# Patient Record
Sex: Female | Born: 1965 | Hispanic: No | Marital: Married | Smoking: Never smoker
Health system: Southern US, Community
[De-identification: ages and names within clinical notes are randomized; demographics above are authoritative.]

## PROBLEM LIST (undated history)

## (undated) DIAGNOSIS — J339 Nasal polyp, unspecified: Secondary | ICD-10-CM

## (undated) DIAGNOSIS — M069 Rheumatoid arthritis, unspecified: Secondary | ICD-10-CM

## (undated) DIAGNOSIS — L309 Dermatitis, unspecified: Secondary | ICD-10-CM

## (undated) DIAGNOSIS — J45909 Unspecified asthma, uncomplicated: Secondary | ICD-10-CM

## (undated) DIAGNOSIS — L509 Urticaria, unspecified: Secondary | ICD-10-CM

## (undated) DIAGNOSIS — L409 Psoriasis, unspecified: Secondary | ICD-10-CM

## (undated) HISTORY — DX: Unspecified asthma, uncomplicated: J45.909

## (undated) HISTORY — PX: SINOSCOPY: SHX187

## (undated) HISTORY — PX: BLADDER REPAIR: SHX76

## (undated) HISTORY — DX: Nasal polyp, unspecified: J33.9

## (undated) HISTORY — DX: Urticaria, unspecified: L50.9

## (undated) HISTORY — DX: Rheumatoid arthritis, unspecified: M06.9

## (undated) HISTORY — DX: Dermatitis, unspecified: L30.9

## (undated) HISTORY — PX: ADENOIDECTOMY: SUR15

---

## 1985-08-08 HISTORY — PX: OTHER SURGICAL HISTORY: SHX169

## 2000-09-15 ENCOUNTER — Other Ambulatory Visit: Admission: RE | Admit: 2000-09-15 | Discharge: 2000-09-15 | Payer: Self-pay | Admitting: *Deleted

## 2004-06-23 ENCOUNTER — Other Ambulatory Visit: Admission: RE | Admit: 2004-06-23 | Discharge: 2004-06-23 | Payer: Self-pay | Admitting: *Deleted

## 2005-10-12 ENCOUNTER — Other Ambulatory Visit: Admission: RE | Admit: 2005-10-12 | Discharge: 2005-10-12 | Payer: Self-pay | Admitting: *Deleted

## 2005-11-04 ENCOUNTER — Encounter: Admission: RE | Admit: 2005-11-04 | Discharge: 2005-11-04 | Payer: Self-pay | Admitting: Nurse Practitioner

## 2007-12-11 ENCOUNTER — Other Ambulatory Visit: Admission: RE | Admit: 2007-12-11 | Discharge: 2007-12-11 | Payer: Self-pay | Admitting: Family Medicine

## 2010-08-29 ENCOUNTER — Encounter: Payer: Self-pay | Admitting: Family Medicine

## 2010-12-17 ENCOUNTER — Other Ambulatory Visit: Payer: Self-pay | Admitting: Family Medicine

## 2010-12-17 ENCOUNTER — Other Ambulatory Visit (HOSPITAL_COMMUNITY)
Admission: RE | Admit: 2010-12-17 | Discharge: 2010-12-17 | Disposition: A | Discharge status: Home / Self Care | Payer: BC Managed Care – PPO | Source: Ambulatory Visit | Attending: Family Medicine | Admitting: Family Medicine

## 2010-12-17 DIAGNOSIS — Z124 Encounter for screening for malignant neoplasm of cervix: Secondary | ICD-10-CM | POA: Insufficient documentation

## 2010-12-17 DIAGNOSIS — Z1159 Encounter for screening for other viral diseases: Secondary | ICD-10-CM | POA: Insufficient documentation

## 2012-10-03 ENCOUNTER — Other Ambulatory Visit (HOSPITAL_COMMUNITY): Payer: Self-pay | Admitting: Family Medicine

## 2012-10-03 DIAGNOSIS — Z1231 Encounter for screening mammogram for malignant neoplasm of breast: Secondary | ICD-10-CM

## 2012-10-10 ENCOUNTER — Ambulatory Visit (HOSPITAL_COMMUNITY)
Admission: RE | Admit: 2012-10-10 | Discharge: 2012-10-10 | Disposition: A | Discharge status: Home / Self Care | Payer: BC Managed Care – PPO | Source: Ambulatory Visit | Attending: Family Medicine | Admitting: Family Medicine

## 2012-10-10 DIAGNOSIS — Z1231 Encounter for screening mammogram for malignant neoplasm of breast: Secondary | ICD-10-CM

## 2013-03-27 ENCOUNTER — Emergency Department (HOSPITAL_BASED_OUTPATIENT_CLINIC_OR_DEPARTMENT_OTHER)
Admission: EM | Admit: 2013-03-27 | Discharge: 2013-03-27 | Disposition: A | Discharge status: Home / Self Care | Payer: BC Managed Care – PPO | Attending: Emergency Medicine | Admitting: Emergency Medicine

## 2013-03-27 ENCOUNTER — Encounter (HOSPITAL_BASED_OUTPATIENT_CLINIC_OR_DEPARTMENT_OTHER): Payer: Self-pay | Admitting: Emergency Medicine

## 2013-03-27 DIAGNOSIS — S91119A Laceration without foreign body of unspecified toe without damage to nail, initial encounter: Secondary | ICD-10-CM

## 2013-03-27 DIAGNOSIS — Z880 Allergy status to penicillin: Secondary | ICD-10-CM | POA: Insufficient documentation

## 2013-03-27 DIAGNOSIS — S91109A Unspecified open wound of unspecified toe(s) without damage to nail, initial encounter: Secondary | ICD-10-CM | POA: Insufficient documentation

## 2013-03-27 DIAGNOSIS — W268XXA Contact with other sharp object(s), not elsewhere classified, initial encounter: Secondary | ICD-10-CM | POA: Insufficient documentation

## 2013-03-27 DIAGNOSIS — Z872 Personal history of diseases of the skin and subcutaneous tissue: Secondary | ICD-10-CM | POA: Insufficient documentation

## 2013-03-27 DIAGNOSIS — Y93E1 Activity, personal bathing and showering: Secondary | ICD-10-CM | POA: Insufficient documentation

## 2013-03-27 DIAGNOSIS — Y9289 Other specified places as the place of occurrence of the external cause: Secondary | ICD-10-CM | POA: Insufficient documentation

## 2013-03-27 HISTORY — DX: Psoriasis, unspecified: L40.9

## 2013-03-27 MED ORDER — GELATIN ABSORBABLE 12-7 MM EX MISC
1.0000 | Freq: Once | CUTANEOUS | Status: AC
  Administered 2013-03-27: 15:00:00 via TOPICAL

## 2013-03-27 MED ORDER — GELATIN ABSORBABLE 12-7 MM EX MISC
CUTANEOUS | Status: AC
  Filled 2013-03-27: qty 1

## 2013-03-27 NOTE — ED Provider Notes (Signed)
  CSN: [id number]     Arrival date & time 03/27/13  1353 History     First MD Initiated Contact with Patient 03/27/13 1358     Chief Complaint  Patient presents with  . Laceration   (Consider location/radiation/quality/duration/timing/severity/associated sxs/prior Treatment) Patient is a 47 y.o. female presenting with skin laceration. The history is provided by the patient. No language interpreter was used.  Laceration Location:  Toe Toe laceration location:  L little toe Length (cm):  1 Quality: avulsion   Foreign body present:  No foreign bodies Ineffective treatments:  None tried   Past Medical History  Diagnosis Date  . Psoriasis    Past Surgical History  Procedure Laterality Date  . Bladder repair     No family history on file. History  Substance Use Topics  . Smoking status: Never Smoker   . Smokeless tobacco: Not on file  . Alcohol Use: No   OB History   Grav Para Term Preterm Abortions TAB SAB Ect Mult Living                 Review of Systems  Skin: Positive for wound.  All other systems reviewed and are negative.    Allergies  Penicillins  Home Medications  No current outpatient prescriptions on file. BP 140/83  Pulse 66  Ht 5\' 4"  (1.626 m)  Wt 140 lb (63.504 kg)  BMI 24.02 kg/m2  SpO2 100% Physical Exam  Vitals reviewed. Constitutional: She appears well-developed and well-nourished.  HENT:  Head: Normocephalic.  Musculoskeletal: She exhibits tenderness.  1cm laceration between toes, nv and ns intact  Neurological: She is alert.  Skin: Skin is warm.  Psychiatric: She has a normal mood and affect.    ED Course   Procedures (including critical care time)  Labs Reviewed - No data to display No results found. 1. Laceration of toe, left, initial encounter     MDM  Gelfoam to laceration   Elson Areas, PA-C 03/27/13 1453

## 2013-03-27 NOTE — ED Notes (Signed)
Cut bottom of left pinky toe this am while in shower. Tetanus shot up to date

## 2013-03-28 NOTE — ED Provider Notes (Signed)
Medical screening examination/treatment/procedure(s) were performed by non-physician practitioner and as supervising physician I was immediately available for consultation/collaboration.   Audree Camel, MD 03/28/13 [phone]

## 2013-09-06 ENCOUNTER — Other Ambulatory Visit (HOSPITAL_COMMUNITY): Payer: Self-pay | Admitting: Family Medicine

## 2013-09-06 DIAGNOSIS — Z1231 Encounter for screening mammogram for malignant neoplasm of breast: Secondary | ICD-10-CM

## 2013-10-14 ENCOUNTER — Other Ambulatory Visit (HOSPITAL_COMMUNITY): Payer: Self-pay | Admitting: Family Medicine

## 2013-10-14 ENCOUNTER — Ambulatory Visit (HOSPITAL_COMMUNITY)
Admission: RE | Admit: 2013-10-14 | Discharge: 2013-10-14 | Disposition: A | Discharge status: Home / Self Care | Payer: BC Managed Care – PPO | Source: Ambulatory Visit | Attending: Family Medicine | Admitting: Family Medicine

## 2013-10-14 DIAGNOSIS — Z1231 Encounter for screening mammogram for malignant neoplasm of breast: Secondary | ICD-10-CM

## 2014-11-04 ENCOUNTER — Other Ambulatory Visit (HOSPITAL_COMMUNITY): Payer: Self-pay | Admitting: Family Medicine

## 2014-11-04 DIAGNOSIS — Z1231 Encounter for screening mammogram for malignant neoplasm of breast: Secondary | ICD-10-CM

## 2014-11-10 ENCOUNTER — Ambulatory Visit (HOSPITAL_COMMUNITY)
Admission: RE | Admit: 2014-11-10 | Discharge: 2014-11-10 | Disposition: A | Discharge status: Home / Self Care | Payer: BLUE CROSS/BLUE SHIELD | Source: Ambulatory Visit | Attending: Family Medicine | Admitting: Family Medicine

## 2014-11-10 DIAGNOSIS — Z1231 Encounter for screening mammogram for malignant neoplasm of breast: Secondary | ICD-10-CM | POA: Diagnosis present

## 2014-11-14 ENCOUNTER — Other Ambulatory Visit (HOSPITAL_COMMUNITY)
Admission: RE | Admit: 2014-11-14 | Discharge: 2014-11-14 | Disposition: A | Discharge status: Home / Self Care | Payer: BLUE CROSS/BLUE SHIELD | Source: Ambulatory Visit | Attending: Family Medicine | Admitting: Family Medicine

## 2014-11-14 ENCOUNTER — Other Ambulatory Visit: Payer: Self-pay | Admitting: Family Medicine

## 2014-11-14 DIAGNOSIS — Z1151 Encounter for screening for human papillomavirus (HPV): Secondary | ICD-10-CM | POA: Diagnosis present

## 2014-11-14 DIAGNOSIS — Z124 Encounter for screening for malignant neoplasm of cervix: Secondary | ICD-10-CM | POA: Insufficient documentation

## 2014-11-17 LAB — CYTOLOGY - PAP

## 2015-11-17 ENCOUNTER — Other Ambulatory Visit: Payer: Self-pay

## 2015-11-17 DIAGNOSIS — Z1231 Encounter for screening mammogram for malignant neoplasm of breast: Secondary | ICD-10-CM

## 2015-12-02 ENCOUNTER — Ambulatory Visit
Admission: RE | Admit: 2015-12-02 | Discharge: 2015-12-02 | Disposition: A | Discharge status: Home / Self Care | Payer: BLUE CROSS/BLUE SHIELD | Source: Ambulatory Visit

## 2015-12-02 DIAGNOSIS — Z1231 Encounter for screening mammogram for malignant neoplasm of breast: Secondary | ICD-10-CM

## 2015-12-24 ENCOUNTER — Encounter: Payer: Self-pay | Admitting: Allergy and Immunology

## 2015-12-24 ENCOUNTER — Ambulatory Visit (INDEPENDENT_AMBULATORY_CARE_PROVIDER_SITE_OTHER): Payer: BLUE CROSS/BLUE SHIELD | Admitting: Allergy and Immunology

## 2015-12-24 VITALS — BP 100/70 | HR 68 | Resp 12 | Ht 63.5 in | Wt 139.6 lb

## 2015-12-24 DIAGNOSIS — J339 Nasal polyp, unspecified: Secondary | ICD-10-CM

## 2015-12-24 DIAGNOSIS — J4540 Moderate persistent asthma, uncomplicated: Secondary | ICD-10-CM | POA: Diagnosis not present

## 2015-12-24 DIAGNOSIS — J3089 Other allergic rhinitis: Secondary | ICD-10-CM | POA: Diagnosis not present

## 2015-12-24 DIAGNOSIS — L409 Psoriasis, unspecified: Secondary | ICD-10-CM | POA: Insufficient documentation

## 2015-12-24 MED ORDER — LEVOCETIRIZINE DIHYDROCHLORIDE 5 MG PO TABS: 5.0000 mg | ORAL_TABLET | Freq: Every day | ORAL | Status: DC

## 2015-12-24 MED ORDER — MONTELUKAST SODIUM 10 MG PO TABS: 10.0000 mg | ORAL_TABLET | Freq: Every day | ORAL | Status: DC

## 2015-12-24 MED ORDER — BECLOMETHASONE DIPROPIONATE 80 MCG/ACT IN AERS: 2.0000 | INHALATION_SPRAY | Freq: Two times a day (BID) | RESPIRATORY_TRACT | Status: DC

## 2015-12-24 MED ORDER — AEROCHAMBER MV MISC: Status: DC

## 2015-12-24 NOTE — Progress Notes (Signed)
New Patient Note   Jessica Clark MRN: [id number] DOB: Oct 09, 1965 Date of Office Visit: 12/24/2015  Referring provider: Keturah Barre, MD Primary care provider: Gaye Alken, MD  Chief Complaint: Nasal Congestion; Allergic Rhinitis ; and Asthma   History of present illness: HPI Comments: Jessica Clark is a 50 y.o. female presenting today for consultation of allergic rhinitis.  She reports that over the past 10 months she has experienced severe nasal congestion and anosmia.  She was evaluated by Dr. Haroldine Laws this past week and was diagnosed with nasal polyposis.  Polypectomy is scheduled for June.  She was diagnosed with asthma in childhood.  Her asthma is typically well controlled with Dulera 100/5 g, one inhalation twice a day without a spacer device.  Her asthma is triggered by upper respiratory tract infections, vigorous exercise, and heavy pollution, such as when she visits her husband at his workplace in [place].  In addition, both her upper and lower respiratory symptoms are exacerbated by exposure to cats and pollens.  She denies adverse symptoms with aspirin and other NSAIDs.   Assessment and plan: Nasal polyposis  Follow with Dr. Haroldine Laws for polypectomy. As leukotriene receptor antagonists have been shown to be beneficial some patients with nasal polyposis and perennial allergic rhinitis,  prescription has been provided for montelukast 10 mg daily at bedtime.  For now, continue fluticasone nasal spray, one spray per nostril twice a day.  I have also recommended nasal saline spray (i.e., Simply Saline) or nasal saline lavage (i.e., NeilMed) as needed prior to medicated nasal sprays.  Perennial and seasonal allergic rhinitis  Aeroallergen avoidance measures have been discussed and provided in written form.  A prescription has been provided for levocetirizine, 5 mg daily as needed.  A prescription has been provided for montelukast (as  above).  Fluticasone nasal spray and nasal saline irrigation (as above).  If allergen avoidance measures and medications fail to adequately relieve symptoms, aeroallergen immunotherapy will be considered.  Moderate persistent asthma  A prescription has been provided for Qvar (beclomethasone) 80 g, 2 inhalations twice a day.  To maximize pulmonary deposition, a spacer has been provided along with instructions for its proper administration with an HFA inhaler.  The patient will start montelukast (as above).  Continue albuterol HFA, 1-2 inhalations every 4-6 hours as needed and 15 minutes prior to vigorous exercise.  Subjective and objective measures of pulmonary function will be followed and the treatment plan will be adjusted accordingly.    Meds ordered this encounter  Medications  . montelukast (SINGULAIR) 10 MG tablet    Sig: Take 1 tablet (10 mg total) by mouth at bedtime.    Dispense:  30 tablet    Refill:  5  . levocetirizine (XYZAL) 5 MG tablet    Sig: Take 1 tablet (5 mg total) by mouth daily.    Dispense:  30 tablet    Refill:  5  . beclomethasone (QVAR) 80 MCG/ACT inhaler    Sig: Inhale 2 puffs into the lungs 2 (two) times daily. Rinse, gargle and spit after use.  Use with spacer.    Dispense:  1 Inhaler    Refill:  3  . Spacer/Aero-Holding Chambers (AEROCHAMBER MV) inhaler    Sig: Use as directed with MDI.    Dispense:  1 each    Refill:  2    Diagnositics: Spirometry: FVC was 2.95 L and FEV1 was 2.20 L (81% predicted) with 10% postbronchodilator improvement. Epicutaneous testing: Positive to grass pollens tree  pollens, molds, cat hair, and dust mite antigen. Intradermal testing: Positive to dog epithelia and cockroach antigen.    Physical examination: Blood pressure 100/70, pulse 68, resp. rate 12, height 5' 3.5" (1.613 m), weight 139 lb 8.8 oz (63.3 kg).  General: Alert, interactive, in no acute distress. HEENT: TMs pearly gray, turbinates edematous with  clear discharge, post-pharynx moderately erythematous. A polyp is visualized on the right and she has leftward septal deviation. Neck: Supple without lymphadenopathy. Lungs: Clear to auscultation without wheezing, rhonchi or rales. CV: Normal S1, S2 without murmurs. Abdomen: Nondistended, nontender. Skin: Warm and dry, without lesions or rashes. Extremities:  No clubbing, cyanosis or edema. Neuro:   Grossly intact.  Review of systems:  Review of Systems  Constitutional: Negative for fever, chills and weight loss.  HENT: Positive for congestion. Negative for nosebleeds.   Eyes: Negative for blurred vision.  Respiratory: Positive for cough, shortness of breath and wheezing. Negative for hemoptysis.   Cardiovascular: Negative for chest pain.  Gastrointestinal: Negative for diarrhea and constipation.  Genitourinary: Negative for dysuria.  Musculoskeletal: Negative for myalgias and joint pain.  Skin: Negative for itching and rash.  Neurological: Negative for dizziness.  Endo/Heme/Allergies: Positive for environmental allergies. Does not bruise/bleed easily.    Past medical history:  Past Medical History  Diagnosis Date  . Psoriasis   . Asthma   . Psoriasis   . Urticaria   . Nasal polyp     Past surgical history:  Past Surgical History  Procedure Laterality Date  . Bladder repair    . Nasal polyps  1987    Family history: History reviewed. No pertinent family history.  Social history: Social History   Social History  . Marital Status: Unknown    Spouse Name: N/A  . Number of Children: N/A  . Years of Education: N/A   Occupational History  . Not on file.   Social History Main Topics  . Smoking status: Never Smoker   . Smokeless tobacco: Not on file  . Alcohol Use: No  . Drug Use: No  . Sexual Activity: Not on file   Other Topics Concern  . Not on file   Social History Narrative   Environmental History: The patient lives in a house with hardwood floors  throughout and carpeting in the bedroom.  There is gas heat and central air in the house.  There are 2 dogs in the house which do not have access to her bedroom.  She is a nonsmoker.    Medication List       This list is accurate as of: 12/24/15  7:19 PM.  Always use your most recent med list.               AEROCHAMBER MV inhaler  Use as directed with MDI.     albuterol 108 (90 Base) MCG/ACT inhaler  Commonly known as:  PROVENTIL HFA;VENTOLIN HFA  Inhale 2 puffs into the lungs every 6 (six) hours as needed for wheezing or shortness of breath.     beclomethasone 80 MCG/ACT inhaler  Commonly known as:  QVAR  Inhale 2 puffs into the lungs 2 (two) times daily. Rinse, gargle and spit after use.  Use with spacer.     BLACK COHOSH PO  Take by mouth.     calcium carbonate 1500 (600 Ca) MG Tabs tablet  Commonly known as:  OSCAL  Take by mouth 2 (two) times daily with a meal.     clobetasol 0.05 % external  solution  Commonly known as:  TEMOVATE     DULERA 100-5 MCG/ACT Aero  Generic drug:  mometasone-formoterol  Inhale 2 puffs into the lungs 2 (two) times daily.     fexofenadine 180 MG tablet  Commonly known as:  ALLEGRA  Take 180 mg by mouth daily.     fluticasone 50 MCG/ACT nasal spray  Commonly known as:  FLONASE  21 SPRAY IN EACH NOSTRIL ONCE A DAY NASALLY 30 DAY(S) **HOLD FOR DOC CLARIFICATION ON SIG**     HAIR VITAMINS PO  Take by mouth.     levocetirizine 5 MG tablet  Commonly known as:  XYZAL  Take 1 tablet (5 mg total) by mouth daily.     montelukast 10 MG tablet  Commonly known as:  SINGULAIR  Take 1 tablet (10 mg total) by mouth at bedtime.        Known medication allergies: Allergies  Allergen Reactions  . Penicillins     I appreciate the opportunity to take part in this [facility] care. Please do not hesitate to contact me with questions.  Sincerely,   R. Jorene Guest, MD

## 2015-12-24 NOTE — Assessment & Plan Note (Addendum)
   Follow with Dr. Haroldine Lawsrossley for polypectomy. As leukotriene receptor antagonists have been shown to be beneficial some patients with nasal polyposis and perennial allergic rhinitis,  prescription has been provided for montelukast 10 mg daily at bedtime.  For now, continue fluticasone nasal spray, one spray per nostril twice a day.  I have also recommended nasal saline spray (i.e., Simply Saline) or nasal saline lavage (i.e., NeilMed) as needed prior to medicated nasal sprays.

## 2015-12-24 NOTE — Assessment & Plan Note (Addendum)
   Aeroallergen avoidance measures have been discussed and provided in written form.  A prescription has been provided for levocetirizine, 5 mg daily as needed.  A prescription has been provided for montelukast (as above).  Fluticasone nasal spray and nasal saline irrigation (as above).  If allergen avoidance measures and medications fail to adequately relieve symptoms, aeroallergen immunotherapy will be considered.

## 2015-12-24 NOTE — Assessment & Plan Note (Signed)
   A prescription has been provided for Qvar (beclomethasone) 80 g, 2 inhalations twice a day.  To maximize pulmonary deposition, a spacer has been provided along with instructions for its proper administration with an HFA inhaler.  The patient will start montelukast (as above).  Continue albuterol HFA, 1-2 inhalations every 4-6 hours as needed and 15 minutes prior to vigorous exercise.  Subjective and objective measures of pulmonary function will be followed and the treatment plan will be adjusted accordingly.

## 2015-12-24 NOTE — Patient Instructions (Addendum)
Nasal polyposis  Follow with Dr. Haroldine Laws for polypectomy. As leukotriene receptor antagonists have been shown to be beneficial some patients with nasal polyposis and perennial allergic rhinitis,  prescription has been provided for montelukast 10 mg daily at bedtime.  For now, continue fluticasone nasal spray, one spray per nostril twice a day.  I have also recommended nasal saline spray (i.e., Simply Saline) or nasal saline lavage (i.e., NeilMed) as needed prior to medicated nasal sprays.  Perennial and seasonal allergic rhinitis  Aeroallergen avoidance measures have been discussed and provided in written form.  A prescription has been provided for levocetirizine, 5 mg daily as needed.  A prescription has been provided for montelukast (as above).  Fluticasone nasal spray and nasal saline irrigation (as above).  If allergen avoidance measures and medications fail to adequately relieve symptoms, aeroallergen immunotherapy will be considered.  Moderate persistent asthma  A prescription has been provided for Qvar (beclomethasone) 80 g, 2 inhalations twice a day.  To maximize pulmonary deposition, a spacer has been provided along with instructions for its proper administration with an HFA inhaler.  The patient will start montelukast (as above).  Continue albuterol HFA, 1-2 inhalations every 4-6 hours as needed and 15 minutes prior to vigorous exercise.  Subjective and objective measures of pulmonary function will be followed and the treatment plan will be adjusted accordingly.    Return in about 3 months (around 03/25/2016), or if symptoms worsen or fail to improve.  Reducing Pollen Exposure  The American Academy of Allergy, Asthma and Immunology suggests the following steps to reduce your exposure to pollen during allergy seasons.    1. Do not hang sheets or clothing out to dry; pollen may collect on these items. 2. Do not mow lawns or spend time around freshly cut grass;  mowing stirs up pollen. 3. Keep windows closed at night.  Keep car windows closed while driving. 4. Minimize morning activities outdoors, a time when pollen counts are usually at their highest. 5. Stay indoors as much as possible when pollen counts or humidity is high and on windy days when pollen tends to remain in the air longer. 6. Use air conditioning when possible.  Many air conditioners have filters that trap the pollen spores. 7. Use a HEPA room air filter to remove pollen form the indoor air you breathe.   Control of House Dust Mite Allergen  House dust mites play a major role in allergic asthma and rhinitis.  They occur in environments with high humidity wherever human skin, the food for dust mites is found. High levels have been detected in dust obtained from mattresses, pillows, carpets, upholstered furniture, bed covers, clothes and soft toys.  The principal allergen of the house dust mite is found in its feces.  A gram of dust may contain 1,000 mites and 250,000 fecal particles.  Mite antigen is easily measured in the air during house cleaning activities.    1. Encase mattresses, including the box spring, and pillow, in an air tight cover.  Seal the zipper end of the encased mattresses with wide adhesive tape. 2. Wash the bedding in water of 130 degrees Farenheit weekly.  Avoid cotton comforters/quilts and flannel bedding: the most ideal bed covering is the dacron comforter. 3. Remove all upholstered furniture from the bedroom. 4. Remove carpets, carpet padding, rugs, and non-washable window drapes from the bedroom.  Wash drapes weekly or use plastic window coverings. 5. Remove all non-washable stuffed toys from the bedroom.  Wash stuffed toys weekly.  6. Have the room cleaned frequently with a vacuum cleaner and a damp dust-mop.  The patient should not be in a room which is being cleaned and should wait 1 hour after cleaning before going into the room. 7. Close and seal all heating  outlets in the bedroom.  Otherwise, the room will become filled with dust-laden air.  An electric heater can be used to heat the room. Reduce indoor humidity to less than 50%.  Do not use a humidifier.  Control of Dog or Cat Allergen  Avoidance is the best way to manage a dog or cat allergy. If you have a dog or cat and are allergic to dog or cats, consider removing the dog or cat from the home. If you have a dog or cat but don't want to find it a new home, or if your family wants a pet even though someone in the household is allergic, here are some strategies that may help keep symptoms at bay:  1. Keep the pet out of your bedroom and restrict it to only a few rooms. Be advised that keeping the dog or cat in only one room will not limit the allergens to that room. 2. Don't pet, hug or kiss the dog or cat; if you do, wash your hands with soap and water. 3. High-efficiency particulate air (HEPA) cleaners run continuously in a bedroom or living room can reduce allergen levels over time. 4. Regular use of a high-efficiency vacuum cleaner or a central vacuum can reduce allergen levels. 5. Giving your dog or cat a bath at least once a week can reduce airborne allergen.  Control of Mold Allergen  Mold and fungi can grow on a variety of surfaces provided certain temperature and moisture conditions exist.  Outdoor molds grow on plants, decaying vegetation and soil.  The major outdoor mold, Alternaria and Cladosporium, are found in very high numbers during hot and dry conditions.  Generally, a late Summer - Fall peak is seen for common outdoor fungal spores.  Rain will temporarily lower outdoor mold spore count, but counts rise rapidly when the rainy period ends.  The most important indoor molds are Aspergillus and Penicillium.  Dark, humid and poorly ventilated basements are ideal sites for mold growth.  The next most common sites of mold growth are the bathroom and the kitchen.  Outdoor [employer] KodakMold  Control 1. Use air conditioning and keep windows closed 2. Avoid exposure to decaying vegetation. 3. Avoid leaf raking. 4. Avoid grain handling. 5. Consider wearing a face mask if working in moldy areas.  Indoor Mold Control 1. Maintain humidity below 50%. 2. Clean washable surfaces with 5% bleach solution. 3. Remove sources e.g. Contaminated carpets.  Control of Cockroach Allergen  Cockroach allergen has been identified as an important cause of acute attacks of asthma, especially in urban settings.  There are fifty-five species of cockroach that exist in the [place], however only three, the [place], [place] and Oriental species produce allergen that can affect patients with Asthma.  Allergens can be obtained from fecal particles, egg casings and secretions from cockroaches.    1. Remove food sources. 2. Reduce access to water. 3. Seal access and entry points. 4. Spray runways with 0.5-1% Diazinon or Chlorpyrifos 5. Blow boric acid power under stoves and refrigerator. 6. Place bait stations (hydramethylnon) at feeding sites.

## 2016-01-01 ENCOUNTER — Other Ambulatory Visit: Payer: Self-pay

## 2016-01-01 ENCOUNTER — Telehealth: Payer: Self-pay

## 2016-01-01 MED ORDER — ALBUTEROL SULFATE HFA 108 (90 BASE) MCG/ACT IN AERS: INHALATION_SPRAY | RESPIRATORY_TRACT | Status: DC

## 2016-01-01 NOTE — Telephone Encounter (Signed)
Patient seen on 12/24/15-[place].  She was given a Midwifesample of Pro Air.   [facility]  Please Advise  Thanks

## 2016-01-01 NOTE — Telephone Encounter (Signed)
Sent to pharmacy. Patient informed.  

## 2016-01-18 ENCOUNTER — Other Ambulatory Visit: Payer: Self-pay | Admitting: Otolaryngology

## 2016-01-20 ENCOUNTER — Ambulatory Visit (INDEPENDENT_AMBULATORY_CARE_PROVIDER_SITE_OTHER): Payer: BLUE CROSS/BLUE SHIELD | Admitting: Allergy and Immunology

## 2016-01-20 ENCOUNTER — Encounter: Payer: Self-pay | Admitting: Allergy and Immunology

## 2016-01-20 VITALS — BP 100/80 | HR 70 | Resp 15

## 2016-01-20 DIAGNOSIS — J3089 Other allergic rhinitis: Secondary | ICD-10-CM | POA: Diagnosis not present

## 2016-01-20 DIAGNOSIS — J4540 Moderate persistent asthma, uncomplicated: Secondary | ICD-10-CM

## 2016-01-20 DIAGNOSIS — J339 Nasal polyp, unspecified: Secondary | ICD-10-CM | POA: Diagnosis not present

## 2016-01-20 DIAGNOSIS — J4541 Moderate persistent asthma with (acute) exacerbation: Secondary | ICD-10-CM | POA: Diagnosis not present

## 2016-01-20 LAB — CBC WITH DIFFERENTIAL/PLATELET
Basophils Absolute: 0 cells/uL (ref 0–200)
Basophils Relative: 0 %
Eosinophils Absolute: 363 cells/uL (ref 15–500)
Eosinophils Relative: 3 %
HCT: 42.1 % (ref 35.0–45.0)
Hemoglobin: 13.6 g/dL (ref 11.7–15.5)
Lymphocytes Relative: 24 %
Lymphs Abs: 2904 cells/uL (ref 850–3900)
MCH: 30.3 pg (ref 27.0–33.0)
MCHC: 32.3 g/dL (ref 32.0–36.0)
MCV: 93.8 fL (ref 80.0–100.0)
MPV: 9.9 fL (ref 7.5–12.5)
Monocytes Absolute: 847 cells/uL (ref 200–950)
Monocytes Relative: 7 %
Neutro Abs: 7986 cells/uL — ABNORMAL HIGH (ref 1500–7800)
Neutrophils Relative %: 66 %
Platelets: 291 10*3/uL (ref 140–400)
RBC: 4.49 MIL/uL (ref 3.80–5.10)
RDW: 13.7 % (ref 11.0–15.0)
WBC: 12.1 10*3/uL — ABNORMAL HIGH (ref 3.8–10.8)

## 2016-01-20 MED ORDER — UMECLIDINIUM BROMIDE 62.5 MCG/INH IN AEPB: 1.0000 | INHALATION_SPRAY | Freq: Every day | RESPIRATORY_TRACT | Status: DC

## 2016-01-20 MED ORDER — METHYLPREDNISOLONE ACETATE 80 MG/ML IJ SUSP
80.0000 mg | Freq: Once | INTRAMUSCULAR | Status: AC
  Administered 2016-01-20: 80 mg via INTRAMUSCULAR

## 2016-01-20 MED ORDER — MONTELUKAST SODIUM 10 MG PO TABS: 10.0000 mg | ORAL_TABLET | Freq: Every day | ORAL | Status: DC

## 2016-01-20 MED ORDER — FLUTICASONE FUROATE-VILANTEROL 200-25 MCG/INH IN AEPB: 1.0000 | INHALATION_SPRAY | Freq: Every day | RESPIRATORY_TRACT | Status: DC

## 2016-01-20 MED ORDER — LEVOCETIRIZINE DIHYDROCHLORIDE 5 MG PO TABS: 5.0000 mg | ORAL_TABLET | Freq: Every day | ORAL | Status: DC

## 2016-01-20 NOTE — Patient Instructions (Addendum)
  1. Breo 200 - one inhalation 1 time per day. Sample. Coupon.  2. Incruse - one inhalation 1 time per day. Sample. Coupon.  3. Depo-Medrol 80 IM delivered in clinic. Prednisone 20 mg a day for 4 days  4. Montelukast 10 mg one tablet once a day  5. Restart nasal fluticasone as directed by Dr. Haroldine Lawsrossley  6. Continue use of ProAir HFA and levocetirizine if needed  7. Blood tests - CBC w/ diff, IgE, ANCA w/reflex  8. Stop Qvar  9. Information on nucala and Xolair  10. Return to clinic in 3 weeks or earlier if problem

## 2016-01-20 NOTE — Progress Notes (Signed)
Follow-up Note  Referring Provider: Juluis Rainier, MD Primary Provider: Gaye Alken, MD Date of Office Visit: 01/20/2016  Subjective:   Jessica Clark (DOB: 07/13/1966) is a 50 y.o. female [facility] in re-evaluation of the following:  HPI: Jessica Clark presents this clinic in evaluation of problems with her asthma. She recently saw Dr. Nunzio Cobbs on 12/24/2015 at which time she had her medical therapy established which included a combination of Qvar and Singulair. Since that point in time she's had problems with wheezing and coughing and shortness of breath and chest tightness and has had use a bronchodilator twice a day usually late at night and upon awakening in the morning. She underwent polypectomy with Dr. Haroldine Laws and has nasal packing which is going to be removed this week.     Medication List           AEROCHAMBER MV inhaler  Use as directed with MDI.     albuterol 108 (90 Base) MCG/ACT inhaler  Commonly known as:  PROAIR HFA  Use 2 puffs every four hours as needed for cough or wheeze.  May use 2 puffs 10-20 minutes prior to exercise.     beclomethasone 80 MCG/ACT inhaler  Commonly known as:  QVAR  Inhale 2 puffs into the lungs 2 (two) times daily. Rinse, gargle and spit after use.  Use with spacer.     BLACK COHOSH PO  Take by mouth.     calcium carbonate 1500 (600 Ca) MG Tabs tablet  Commonly known as:  OSCAL  Take by mouth 2 (two) times daily with a meal.     clindamycin 150 MG capsule  Commonly known as:  CLEOCIN     clobetasol 0.05 % external solution  Commonly known as:  TEMOVATE     HAIR VITAMINS PO  Take by mouth.     levocetirizine 5 MG tablet  Commonly known as:  XYZAL  Take 1 tablet (5 mg total) by mouth daily.     montelukast 10 MG tablet  Commonly known as:  SINGULAIR  Take 1 tablet (10 mg total) by mouth at bedtime.        Past Medical History  Diagnosis Date  . Psoriasis     . Asthma   . Psoriasis   . Urticaria   . Nasal polyp     Past Surgical History  Procedure Laterality Date  . Bladder repair    . Nasal polyps  1987    Allergies  Allergen Reactions  . Penicillins     Review of systems negative except as noted in HPI / PMHx or noted below:  Review of Systems  Constitutional: Negative.   HENT: Negative.   Eyes: Negative.   Respiratory: Negative.   Cardiovascular: Negative.   Gastrointestinal: Negative.   Genitourinary: Negative.   Musculoskeletal: Negative.   Skin: Negative.   Neurological: Negative.   Endo/Heme/Allergies: Negative.   Psychiatric/Behavioral: Negative.      Objective:   Filed Vitals:   01/20/16 0846  BP: 100/80  Pulse: 70  Resp: 15          Physical Exam  Constitutional: She is well-developed, well-nourished, and in no distress.  HENT:  Head: Normocephalic.  Right Ear: Tympanic membrane, external ear and ear canal normal.  Left Ear: Tympanic membrane, external ear and ear canal normal.  Nose: Mucosal edema (Severe mucosal swelling) present. No rhinorrhea.  Mouth/Throat: Uvula is midline, oropharynx is clear and moist and  mucous membranes are normal. No oropharyngeal exudate.  Eyes: Conjunctivae are normal.  Neck: Trachea normal. No tracheal tenderness present. No tracheal deviation present. No thyromegaly present.  Cardiovascular: Normal rate, regular rhythm, S1 normal, S2 normal and normal heart sounds.   No murmur heard. Pulmonary/Chest: No stridor. No respiratory distress. She has wheezes (Bilateral expiratory wheezes in all lung fields). She has no rales.  Musculoskeletal: She exhibits no edema.  Lymphadenopathy:       Head (right side): No tonsillar adenopathy present.       Head (left side): No tonsillar adenopathy present.    She has no cervical adenopathy.  Neurological: She is alert. Gait normal.  Skin: No rash noted. She is not diaphoretic. No erythema. Nails show no clubbing.  Psychiatric:  Mood and affect normal.    Diagnostics:    Spirometry was performed and demonstrated an FEV1 of 2.39 at 95 % of predicted.  The patient had an Asthma Control Test with the following results:  .    Assessment and Plan:   1. Asthma, not well controlled, moderate persistent, with acute exacerbation   2. Perennial and seasonal allergic rhinitis   3. Nasal polyposis   4. Moderate persistent asthma, uncomplicated     1. Breo 200 - one inhalation 1 time per day. Sample. Coupon.  2. Incruse - one inhalation 1 time per day. Sample. Coupon.  3. Depo-Medrol 80 IM delivered in clinic. Prednisone 20 mg a day for 4 days  4. Montelukast 10 mg one tablet once a day  5. Restart nasal fluticasone as directed by Dr. Haroldine Lawsrossley  6. Continue use of ProAir HFA and levocetirizine if needed  7. Blood tests - CBC w/ diff, IgE, ANCA w/reflex  8. Stop Qvar  9. Information on nucala and Xolair  10. Return to clinic in 3 weeks or earlier if problem  Jessica Clark will utilize a combination of systemic steroids and inhalers including a long-acting bronchodilator, and inhaled steroid, and an anticholinergic agent as specified above. I think that she would be a candidate for a biological agent given the fact that she has failed several forms of medical therapy in the past and is currently failing her current medical plan. As well, I think we need to consider the possibility that she may have a more aggressive form of mucosal inflammation given the fact that she has significant asthma along with nasal polyposis and we'll check a ANCA. Further evaluation and treatment will be based upon her response and the results of her diagnostic testing.  Jessica SchimkeEric Kozlow, MD [facility]

## 2016-01-21 LAB — ANCA SCREEN W REFLEX TITER: ANCA Screen: POSITIVE — AB

## 2016-01-21 LAB — RFLX P-ANCA TITER: P-ANCA: 1:20 {titer} — ABNORMAL HIGH

## 2016-01-22 LAB — IGE: IgE (Immunoglobulin E), Serum: 73 kU/L (ref <115)

## 2016-02-04 ENCOUNTER — Ambulatory Visit (INDEPENDENT_AMBULATORY_CARE_PROVIDER_SITE_OTHER): Payer: BLUE CROSS/BLUE SHIELD

## 2016-02-04 DIAGNOSIS — L500 Allergic urticaria: Secondary | ICD-10-CM

## 2016-02-04 DIAGNOSIS — J4550 Severe persistent asthma, uncomplicated: Secondary | ICD-10-CM

## 2016-02-04 MED ORDER — MEPOLIZUMAB 100 MG ~~LOC~~ SOLR
100.0000 mL | SUBCUTANEOUS | Status: AC
  Administered 2016-02-04 – 2017-01-30 (×10): 100 mL via SUBCUTANEOUS

## 2016-03-17 ENCOUNTER — Ambulatory Visit (INDEPENDENT_AMBULATORY_CARE_PROVIDER_SITE_OTHER): Payer: BLUE CROSS/BLUE SHIELD

## 2016-03-17 DIAGNOSIS — J4550 Severe persistent asthma, uncomplicated: Secondary | ICD-10-CM | POA: Diagnosis not present

## 2016-03-17 MED ORDER — EPINEPHRINE 0.3 MG/0.3ML IJ SOAJ: 0.3000 mg | Freq: Once | INTRAMUSCULAR | 3 refills | Status: AC

## 2016-03-28 ENCOUNTER — Encounter (INDEPENDENT_AMBULATORY_CARE_PROVIDER_SITE_OTHER): Payer: Self-pay

## 2016-03-28 ENCOUNTER — Ambulatory Visit (INDEPENDENT_AMBULATORY_CARE_PROVIDER_SITE_OTHER): Payer: BLUE CROSS/BLUE SHIELD | Admitting: Allergy

## 2016-03-28 ENCOUNTER — Ambulatory Visit: Payer: BLUE CROSS/BLUE SHIELD | Admitting: Allergy and Immunology

## 2016-03-28 ENCOUNTER — Encounter: Payer: Self-pay | Admitting: Allergy

## 2016-03-28 VITALS — BP 108/64 | HR 72 | Resp 16

## 2016-03-28 DIAGNOSIS — J4540 Moderate persistent asthma, uncomplicated: Secondary | ICD-10-CM

## 2016-03-28 DIAGNOSIS — J339 Nasal polyp, unspecified: Secondary | ICD-10-CM

## 2016-03-28 DIAGNOSIS — J301 Allergic rhinitis due to pollen: Secondary | ICD-10-CM

## 2016-03-28 MED ORDER — CLOBETASOL PROPIONATE 0.05 % EX SOLN: 1.0000 "application " | Freq: Two times a day (BID) | CUTANEOUS | 3 refills | Status: DC | PRN

## 2016-03-28 NOTE — Progress Notes (Signed)
Follow-up Note  RE: Jessica Clark MRN: [id number] DOB: January 31, 1966 Date of Office Visit: 03/28/2016   History of present illness: Cardelia Clark is a 50 y.o. female presenting today for follow-up of ashtma, allergies.  She was last seen by Dr. Neldon Mc in June 2017.    At the time of last visit she was in acute exacerbation.  She also had labwork done showing an autoimmune signal.  She was changed from Qvar to [facility] after last visit and reports she has had significant improvement in her breathing and hasn't needed to use albuterol at all.  She was also started on Incruse which she takes midday.  Continues singulair at night.  She has also started on Nucala since last visit and has had 2 injections thus far.  No issues with injection.  Denies any daytime or nighttime sypmtoms at this point, no nigthtime awakenings and no further steroids, Ed/urgent care visits or hospitalizations.   Not having any nasal or ocular symptoms or sneezing.  She uses flonase as needed now as directed by Dr. Ernesto Rutherford.  Takes xyzal daily and wondering if she can take as needed.      Review of systems: Review of Systems  Constitutional: Negative for chills and fever.  HENT: Negative for congestion and sore throat.   Eyes: Negative for redness.  Respiratory: Negative for cough, shortness of breath and wheezing.   Cardiovascular: Negative for chest pain.  Gastrointestinal: Negative for nausea and vomiting.  Skin: Negative for rash.  Neurological: Negative for headaches.    All other systems negative unless noted above in HPI  Past medical/social/surgical/family history have been reviewed and are unchanged unless specifically indicated below.  No changes  Medication List:   Medication List       Accurate as of 03/28/16 12:27 PM. Always use your most recent med list.          AEROCHAMBER MV inhaler Use as directed with MDI.   albuterol 108 (90 Base) MCG/ACT inhaler Commonly known as:  PROAIR  HFA Use 2 puffs every four hours as needed for cough or wheeze.  May use 2 puffs 10-20 minutes prior to exercise.   BLACK COHOSH PO Take by mouth.   calcium carbonate 1500 (600 Ca) MG Tabs tablet Commonly known as:  OSCAL Take by mouth 2 (two) times daily with a meal.   clobetasol 0.05 % external solution Commonly known as:  TEMOVATE Apply 1 application topically 2 (two) times daily as needed.   EPIPEN 2-PAK 0.3 mg/0.3 mL Soaj injection Generic drug:  EPINEPHrine Inject 0.3 mg into the muscle as needed.   fluticasone furoate-vilanterol 200-25 MCG/INH Aepb Commonly known as:  BREO ELLIPTA Inhale 1 puff into the lungs daily.   HAIR VITAMINS PO Take by mouth.   levocetirizine 5 MG tablet Commonly known as:  XYZAL Take 1 tablet (5 mg total) by mouth daily.   montelukast 10 MG tablet Commonly known as:  SINGULAIR Take 1 tablet (10 mg total) by mouth at bedtime.   NUCALA 100 MG Solr Generic drug:  Mepolizumab Inject 100 mg into the skin every 28 (twenty-eight) days.   umeclidinium bromide 62.5 MCG/INH Aepb Commonly known as:  INCRUSE ELLIPTA Inhale 1 puff into the lungs daily.       Known medication allergies: Allergies  Allergen Reactions  . Penicillins Other (See Comments)    Blisters in mouth     Physical examination: Blood pressure 108/64, pulse 72, resp. rate 16.  General: Alert, interactive, in  no acute distress. HEENT: TMs pearly gray, turbinates minimally edematous without discharge, post-pharynx non erythematous. Neck: Supple without lymphadenopathy. Lungs: Clear to auscultation without wheezing, rhonchi or rales. {no increased work of breathing. CV: Normal S1, S2 without murmurs. Abdomen: Nondistended, nontender. Skin: Warm and dry, without lesions or rashes. Extremities:  No clubbing, cyanosis or edema. Neuro:   Grossly intact.  Diagnositics/Labs: Labs:  Component     Latest Ref Rng & Units 01/20/2016  WBC     3.8 - 10.8 K/uL 12.1 (H)  RBC      3.80 - 5.10 MIL/uL 4.49  Hemoglobin     11.7 - 15.5 g/dL 13.6  HCT     35.0 - 45.0 % 42.1  MCV     80.0 - 100.0 fL 93.8  MCH     27.0 - 33.0 pg 30.3  MCHC     32.0 - 36.0 g/dL 32.3  RDW     11.0 - 15.0 % 13.7  Platelets     140 - 400 K/uL 291  MPV     7.5 - 12.5 fL 9.9  NEUT#     1,500 - 7,800 cells/uL 7,986 (H)  Lymphocyte #     850 - 3,900 cells/uL 2,904  Monocyte #     200 - 950 cells/uL 847  Eosinophils Absolute     15 - 500 cells/uL 363  Basophils Absolute     0 - 200 cells/uL 0  Neutrophils     % 66  Lymphocytes     % 24  Monocytes Relative     % 7  Eosinophil     % 3  Basophil     % 0  Smear Review      Criteria for review not met  IgE (Immunoglobulin E), Serum     <115 kU/L 73  ANCA Screen      p-ANCA POS (A)  P-ANCA     <1:20 1:20 (H)   Spirometry: FEV1: 2.63L  105%, FVC: 3.17L  104%, ratio consistent with non-obstructive pattern  Assessment and plan:    Asthma, mod persistent - improved  Continue Breo 200  1 Puff daily.  Continue monthly Nucala.  Has Epipen.   Continue albuterol as needed  Stop Incruse (if increase asthma symptoms or need for albuterol use restart Incruse).  Continue Montekulast 24m at bedtime.   Asthma control goals:   Full participation in all desired activities (may need albuterol before activity)  Albuterol use two time or less a week on average (not counting use with activity)  Cough interfering with sleep two time or less a month  Oral steroids no more than once a year  No hospitalizations  Allergic rhinitis Use Xyzal  And Flonase as needed.    Nasal polyposis Continue f/u with Dr. CErnesto Rutherford Follow-up 3-4 months   I appreciate the opportunity to take part in Caci's care. Please do not hesitate to contact me with questions.  Sincerely,   SPrudy Feeler MD Allergy/Immunology [facility]

## 2016-03-28 NOTE — Patient Instructions (Signed)
  Asthma, mod persistent - improved symptoms  Continue Breo 200  1 Puff daily.  Continue monthly Nucala.   Continue albuterol as needed  Stop Incruse (if increase asthma symptoms or need for albuterol use restart Incruse).  Continue Montekulast 10mg  at bedtime.  Use Xyzal  And Flonase as needed.    Follow-up 3-4 months

## 2016-04-14 ENCOUNTER — Ambulatory Visit (INDEPENDENT_AMBULATORY_CARE_PROVIDER_SITE_OTHER): Payer: BLUE CROSS/BLUE SHIELD

## 2016-04-14 ENCOUNTER — Ambulatory Visit: Payer: BLUE CROSS/BLUE SHIELD

## 2016-04-14 DIAGNOSIS — J4540 Moderate persistent asthma, uncomplicated: Secondary | ICD-10-CM | POA: Diagnosis not present

## 2016-05-02 ENCOUNTER — Telehealth: Payer: Self-pay | Admitting: Allergy and Immunology

## 2016-05-02 NOTE — Telephone Encounter (Signed)
Informed pt that she can get levocetirizine over the counter at her pharmacy.

## 2016-05-02 NOTE — Telephone Encounter (Signed)
He insurance will no longer pay for Levocetirizine. Can you please send something else in that her insurance will pay for. She has [employer]. She will need a 3 month supply as she uses [facility].

## 2016-05-12 ENCOUNTER — Ambulatory Visit (INDEPENDENT_AMBULATORY_CARE_PROVIDER_SITE_OTHER): Payer: BLUE CROSS/BLUE SHIELD | Admitting: *Deleted

## 2016-05-12 DIAGNOSIS — J4550 Severe persistent asthma, uncomplicated: Secondary | ICD-10-CM

## 2016-05-20 ENCOUNTER — Other Ambulatory Visit: Payer: Self-pay | Admitting: Allergy and Immunology

## 2016-05-20 DIAGNOSIS — J3089 Other allergic rhinitis: Secondary | ICD-10-CM

## 2016-05-20 DIAGNOSIS — J4540 Moderate persistent asthma, uncomplicated: Secondary | ICD-10-CM

## 2016-07-14 ENCOUNTER — Ambulatory Visit (INDEPENDENT_AMBULATORY_CARE_PROVIDER_SITE_OTHER): Payer: BLUE CROSS/BLUE SHIELD | Admitting: *Deleted

## 2016-07-14 DIAGNOSIS — J4550 Severe persistent asthma, uncomplicated: Secondary | ICD-10-CM | POA: Diagnosis not present

## 2016-08-11 ENCOUNTER — Ambulatory Visit (INDEPENDENT_AMBULATORY_CARE_PROVIDER_SITE_OTHER): Payer: 59

## 2016-08-11 ENCOUNTER — Ambulatory Visit: Payer: BLUE CROSS/BLUE SHIELD

## 2016-08-11 DIAGNOSIS — J4550 Severe persistent asthma, uncomplicated: Secondary | ICD-10-CM | POA: Diagnosis not present

## 2016-08-15 ENCOUNTER — Other Ambulatory Visit: Payer: Self-pay | Admitting: Allergy and Immunology

## 2016-08-15 DIAGNOSIS — J4540 Moderate persistent asthma, uncomplicated: Secondary | ICD-10-CM

## 2016-08-15 DIAGNOSIS — J3089 Other allergic rhinitis: Secondary | ICD-10-CM

## 2016-09-08 ENCOUNTER — Ambulatory Visit: Payer: 59

## 2016-11-02 ENCOUNTER — Ambulatory Visit (INDEPENDENT_AMBULATORY_CARE_PROVIDER_SITE_OTHER): Payer: 59

## 2016-11-02 DIAGNOSIS — J4550 Severe persistent asthma, uncomplicated: Secondary | ICD-10-CM | POA: Diagnosis not present

## 2016-11-14 ENCOUNTER — Other Ambulatory Visit: Payer: Self-pay | Admitting: Allergy and Immunology

## 2016-11-14 DIAGNOSIS — J3089 Other allergic rhinitis: Secondary | ICD-10-CM

## 2016-11-14 DIAGNOSIS — J4540 Moderate persistent asthma, uncomplicated: Secondary | ICD-10-CM

## 2016-11-21 ENCOUNTER — Encounter: Payer: Self-pay | Admitting: Allergy

## 2016-11-21 ENCOUNTER — Ambulatory Visit (INDEPENDENT_AMBULATORY_CARE_PROVIDER_SITE_OTHER): Payer: 59 | Admitting: Allergy

## 2016-11-21 VITALS — BP 120/68 | HR 70 | Resp 16 | Wt 149.0 lb

## 2016-11-21 DIAGNOSIS — J4540 Moderate persistent asthma, uncomplicated: Secondary | ICD-10-CM

## 2016-11-21 DIAGNOSIS — J3089 Other allergic rhinitis: Secondary | ICD-10-CM | POA: Diagnosis not present

## 2016-11-21 MED ORDER — FLUTICASONE FUROATE-VILANTEROL 200-25 MCG/INH IN AEPB: 1.0000 | INHALATION_SPRAY | Freq: Every day | RESPIRATORY_TRACT | 0 refills | Status: DC

## 2016-11-21 MED ORDER — MONTELUKAST SODIUM 10 MG PO TABS: ORAL_TABLET | ORAL | 0 refills | Status: DC

## 2016-11-21 NOTE — Patient Instructions (Addendum)
  Asthma, mod persistent - well controlled at this time  Continue Breo 200  1 Puff daily.  Continue monthly Nucala.   Continue albuterol as needed  Continue Montekulast  at bedtime.  Asthma control goals:   Full participation in all desired activities (may need albuterol before activity)  Albuterol use two time or less a week on average (not counting use with activity)  Cough interfering with sleep two time or less a month  Oral steroids no more than once a year  No hospitalizations   Agree with rheumatology evaluation of joint symptoms.    Follow-up 6 months

## 2016-11-21 NOTE — Progress Notes (Signed)
Follow-up Note  RE: Marnell Mcdaniel MRN: [id number] DOB: [birth year] Date of Office Visit: 11/21/2016   History of present illness: Jessica Clark is a 51 y.o. female presenting today for follow-up of asthma and allergies.  She was last seen in the office on 03/28/2016 by myself.   Since her last visit she has done well. With her asthma she is on daily Breo and Singulair and is on monthly Nucala.  She denies any issues or reactions with her Nucala injections. Since her last visit she has not required any steroids, ED or urgent care visits for her asthma. She does however report she had a "chest infection" while she was visiting her husband in [place] back in February and was treated with antibiotic. She currently denies any daytime or nighttime symptoms.   She denies any nasal or ocular symptoms suggestive of chronic rhinoconjunctivitis. She has not had any allergy medications at this time. She was able to come off of her Xyzal after last visit.    She is having debilitating joint pain bilaterally of her shoulders, knees and larger joints. She reports it takes her about 30 minutes in the morning to get up from the bed.  She does have an upcoming rheumatology appointment.    Review of systems:  Review of Systems  Constitutional: Negative for chills, fever and malaise/fatigue.  HENT: Negative for congestion, ear discharge, ear pain, nosebleeds, sinus pain, sore throat and tinnitus.   Eyes: Negative for discharge and redness.  Respiratory: Negative for cough, shortness of breath and wheezing.   Cardiovascular: Negative for chest pain.  Gastrointestinal: Negative for abdominal pain, diarrhea, heartburn, nausea and vomiting.  Musculoskeletal: Positive for joint pain.  Skin: Negative for itching and rash.    All other systems negative unless noted above in HPI  Past medical/social/surgical/family history have been reviewed and are unchanged unless specifically indicated below.  No  changes  Medication List: Allergies as of 11/21/2016      Reactions   Penicillins Other (See Comments)   Blisters in mouth      Medication List       Accurate as of 11/21/16  1:47 PM. Always use your most recent med list.          AEROCHAMBER MV inhaler Use as directed with MDI.   albuterol 108 (90 Base) MCG/ACT inhaler Commonly known as:  PROAIR HFA Use 2 puffs every four hours as needed for cough or wheeze.  May use 2 puffs 10-20 minutes prior to exercise.   BLACK COHOSH PO Take by mouth.   calcium carbonate 1500 (600 Ca) MG Tabs tablet Commonly known as:  OSCAL Take by mouth 2 (two) times daily with a meal.   clobetasol 0.05 % external solution Commonly known as:  TEMOVATE Apply 1 application topically 2 (two) times daily as needed.   EPIPEN 2-PAK 0.3 mg/0.3 mL Soaj injection Generic drug:  EPINEPHrine Inject 0.3 mg into the muscle as needed.   fluticasone furoate-vilanterol 200-25 MCG/INH Aepb Commonly known as:  BREO ELLIPTA Inhale 1 puff into the lungs daily.   HAIR VITAMINS PO Take by mouth.   levocetirizine 5 MG tablet Commonly known as:  XYZAL Take 1 tablet (5 mg total) by mouth daily.   montelukast 10 MG tablet Commonly known as:  SINGULAIR Take one tablet once daily   NUCALA 100 MG Solr Generic drug:  Mepolizumab Inject 100 mg into the skin every 28 (twenty-eight) days.   PREMARIN 1.25 MG tablet Generic drug:  estrogens (conjugated) Take 1.25 mg by mouth daily.   progesterone 100 MG capsule Commonly known as:  PROMETRIUM Take 100 mg by mouth daily.   umeclidinium bromide 62.5 MCG/INH Aepb Commonly known as:  INCRUSE ELLIPTA Inhale 1 puff into the lungs daily.       Known medication allergies: Allergies  Allergen Reactions  . Penicillins Other (See Comments)    Blisters in mouth     Physical examination: Blood pressure 120/68, pulse 70, resp. rate 16, weight 149 lb (67.6 kg), SpO2 98 %.  General: Alert, interactive, in no  acute distress. HEENT: TMs pearly gray, turbinates non-edematous without discharge, post-pharynx non erythematous. Neck: Supple without lymphadenopathy. Lungs: Clear to auscultation without wheezing, rhonchi or rales. {no increased work of breathing. CV: Normal S1, S2 without murmurs. Abdomen: Nondistended, nontender. Skin: Warm and dry, without lesions or rashes. Extremities:  No clubbing, cyanosis or edema. Neuro:   Grossly intact.  Diagnositics/Labs:  Spirometry: FEV1: 2.53L  101%, FVC: 3.15L  103%, ratio consistent with Nonobstructive pattern  Assessment and plan:    Asthma, mod persistent - well controlled at this time Continue Breo 200  1 Puff daily.   If she continues to be well-controlled consider stepping down to Breo 100 at next visit. Continue monthly Nucala.   Continue albuterol as needed  Continue Montekulast  at bedtime.  Asthma control goals:   Full participation in all desired activities (may need albuterol before activity)  Albuterol use two time or less a week on average (not counting use with activity)  Cough interfering with sleep two time or less a month  Oral steroids no more than once a year  No hospitalizations  Allergic rhinitis  she currently is asymptomatic and does not require any treatment   Agree with rheumatology evaluation of joint symptoms.    Follow-up 6 months  I appreciate the opportunity to take part in Kaelie's care. Please do not hesitate to contact me with questions.  Sincerely,   Margo Aye, MD Allergy/Immunology [facility]

## 2016-11-30 ENCOUNTER — Ambulatory Visit (INDEPENDENT_AMBULATORY_CARE_PROVIDER_SITE_OTHER): Payer: 59 | Admitting: *Deleted

## 2016-11-30 DIAGNOSIS — J4540 Moderate persistent asthma, uncomplicated: Secondary | ICD-10-CM

## 2016-12-28 ENCOUNTER — Ambulatory Visit (INDEPENDENT_AMBULATORY_CARE_PROVIDER_SITE_OTHER): Payer: 59 | Admitting: *Deleted

## 2016-12-28 ENCOUNTER — Other Ambulatory Visit: Payer: Self-pay | Admitting: Allergy and Immunology

## 2016-12-28 DIAGNOSIS — J4550 Severe persistent asthma, uncomplicated: Secondary | ICD-10-CM | POA: Diagnosis not present

## 2017-01-25 ENCOUNTER — Ambulatory Visit: Payer: 59

## 2017-01-30 ENCOUNTER — Ambulatory Visit (INDEPENDENT_AMBULATORY_CARE_PROVIDER_SITE_OTHER): Payer: 59 | Admitting: *Deleted

## 2017-01-30 DIAGNOSIS — J4550 Severe persistent asthma, uncomplicated: Secondary | ICD-10-CM | POA: Diagnosis not present

## 2017-02-11 ENCOUNTER — Other Ambulatory Visit: Payer: Self-pay | Admitting: Allergy and Immunology

## 2017-02-19 ENCOUNTER — Other Ambulatory Visit: Payer: Self-pay | Admitting: Allergy

## 2017-02-19 DIAGNOSIS — J3089 Other allergic rhinitis: Secondary | ICD-10-CM

## 2017-02-19 DIAGNOSIS — J4540 Moderate persistent asthma, uncomplicated: Secondary | ICD-10-CM

## 2017-02-27 ENCOUNTER — Ambulatory Visit: Payer: 59

## 2017-05-14 ENCOUNTER — Other Ambulatory Visit: Payer: Self-pay | Admitting: Allergy and Immunology

## 2017-05-20 ENCOUNTER — Other Ambulatory Visit: Payer: Self-pay | Admitting: Allergy

## 2017-05-20 DIAGNOSIS — J3089 Other allergic rhinitis: Secondary | ICD-10-CM

## 2017-05-20 DIAGNOSIS — J4540 Moderate persistent asthma, uncomplicated: Secondary | ICD-10-CM

## 2017-10-02 ENCOUNTER — Telehealth: Payer: Self-pay | Admitting: Allergy

## 2017-10-02 DIAGNOSIS — J4540 Moderate persistent asthma, uncomplicated: Secondary | ICD-10-CM

## 2017-10-02 DIAGNOSIS — J3089 Other allergic rhinitis: Secondary | ICD-10-CM

## 2017-10-02 MED ORDER — FLUTICASONE FUROATE-VILANTEROL 200-25 MCG/INH IN AEPB: 1.0000 | INHALATION_SPRAY | Freq: Every day | RESPIRATORY_TRACT | 0 refills | Status: DC

## 2017-10-02 MED ORDER — MONTELUKAST SODIUM 10 MG PO TABS: 10.0000 mg | ORAL_TABLET | Freq: Every day | ORAL | 0 refills | Status: DC

## 2017-10-02 NOTE — Telephone Encounter (Signed)
Patient called requesting a refill for Breo and Singulair. I told her she had not been seen since 11-2016 and would need to make an appt. She did for 10-19-17 and would like to know if prescriptions could be sent into [facility], enough to get her to her appt.

## 2017-10-02 NOTE — Telephone Encounter (Signed)
Called and informed patient that I have sent in those refills and she would need to keep her office visit on 10/19/2017 for further refills.

## 2017-10-02 NOTE — Addendum Note (Signed)
Addended by: Dub MikesHICKS, ASHLEY N on: 10/02/2017 05:00 PM   Modules accepted: Orders

## 2017-10-19 ENCOUNTER — Ambulatory Visit: Payer: 59 | Admitting: Allergy

## 2017-10-19 DIAGNOSIS — J309 Allergic rhinitis, unspecified: Secondary | ICD-10-CM

## 2017-11-14 ENCOUNTER — Other Ambulatory Visit: Payer: Self-pay

## 2017-11-16 ENCOUNTER — Encounter: Payer: Self-pay | Admitting: Allergy

## 2017-11-16 ENCOUNTER — Ambulatory Visit: Payer: BC Managed Care – PPO | Admitting: Allergy

## 2017-11-16 VITALS — BP 112/68 | HR 76 | Resp 18

## 2017-11-16 DIAGNOSIS — J4540 Moderate persistent asthma, uncomplicated: Secondary | ICD-10-CM

## 2017-11-16 DIAGNOSIS — J3089 Other allergic rhinitis: Secondary | ICD-10-CM

## 2017-11-16 MED ORDER — FLUTICASONE FUROATE 100 MCG/ACT IN AEPB: 1.0000 | INHALATION_SPRAY | Freq: Every day | RESPIRATORY_TRACT | 0 refills | Status: DC

## 2017-11-16 NOTE — Progress Notes (Signed)
Follow-up Note  RE: Jessica LeesRachel Strathman MRN: [id number] DOB: [birth year] Date of Office Visit: 11/16/2017   History of present illness: Jessica Clark is a 52 y.o. female presenting today for follow-up of asthma and allergic rhinitis.  She was last seen in the office on 11/21/16 by myself.  She states she has been doing very well since her last visit.  She states her breathing has been "phenomonal" and she has not had any flares.  She is on Breo 200 1 puff once a day.  She has not needed to use albuterol at all.  She also is on singulair daily.  She states she stopped traveling as much as she use to and wonders if that is a reason that her breathing has improved with less airport/plane exposure.   She denies any ED/UC visits, no oral steroids or hospitalizations and no nighttime awakenings.     With her allergies she denies any significant symptoms and does not take any medications besides singulair.      She still follows with her rhuematologist and states she has been doing well without any significant joint pains.     Review of systems: Review of Systems  Constitutional: Negative for fever, malaise/fatigue and weight loss.  HENT: Negative for congestion, ear discharge, ear pain, nosebleeds, sinus pain and sore throat.   Eyes: Negative for pain, discharge and redness.  Respiratory: Negative for cough, shortness of breath and wheezing.   Cardiovascular: Negative for chest pain.  Gastrointestinal: Negative for abdominal pain, constipation, diarrhea, heartburn, nausea and vomiting.  Musculoskeletal: Positive for joint pain (improved).  Skin: Negative for itching and rash.  Neurological: Negative for headaches.    All other systems negative unless noted above in HPI  Past medical/social/surgical/family history have been reviewed and are unchanged unless specifically indicated below.  No changes  Medication List: Allergies as of 11/16/2017      Reactions   Penicillins Other (See  Comments)   Blisters in mouth      Medication List        Accurate as of 11/16/17  6:11 PM. Always use your most recent med list.          albuterol 108 (90 Base) MCG/ACT inhaler Commonly known as:  PROAIR HFA Use 2 puffs every four hours as needed for cough or wheeze.  May use 2 puffs 10-20 minutes prior to exercise.   clobetasol 0.05 % external solution Commonly known as:  TEMOVATE Apply 1 application topically 2 (two) times daily as needed.   DIVIGEL 1 MG/GM Gel Generic drug:  Estradiol   fluticasone furoate-vilanterol 200-25 MCG/INH Aepb Commonly known as:  BREO ELLIPTA Inhale 1 puff into the lungs daily.   HUMIRA PEN 40 MG/0.4ML Pnkt Generic drug:  Adalimumab   levocetirizine 5 MG tablet Commonly known as:  XYZAL Take 1 tablet (5 mg total) by mouth daily.   montelukast 10 MG tablet Commonly known as:  SINGULAIR Take 1 tablet (10 mg total) by mouth daily.   PREMARIN 1.25 MG tablet Generic drug:  estrogens (conjugated) Take 1.25 mg by mouth daily.   progesterone 100 MG capsule Commonly known as:  PROMETRIUM Take 100 mg by mouth daily.       Known medication allergies: Allergies  Allergen Reactions  . Penicillins Other (See Comments)    Blisters in mouth     Physical examination: Blood pressure 112/68, pulse 76, resp. rate 18, SpO2 97 %.  General: Alert, interactive, in no acute distress. HEENT: PERRLA, TMs  pearly gray, turbinates non-edematous without discharge, post-pharynx non erythematous. Neck: Supple without lymphadenopathy. Lungs: Clear to auscultation without wheezing, rhonchi or rales. {no increased work of breathing. CV: Normal S1, S2 without murmurs. Abdomen: Nondistended, nontender. Skin: Warm and dry, without lesions or rashes. Extremities:  No clubbing, cyanosis or edema. Neuro:   Grossly intact.  Diagnositics/Labs:  Spirometry: FEV1: 2.65L  100%, FVC: 3.21L  95%, ratio consistent with nonobstructive pattern  Assessment and  plan:  Asthma, mod persistent - very well controlled at this time  - Will wean down to Arnuity 1 puff once a day  - If you are meeting the below control goals and not having any asthma related symptoms then in 3-4 months stop Arnuity.   - Continue Montekulast 10mg  at bedtime.   - Have access to albuterol inhaler 2 puffs every 4-6 hours as needed for cough/wheeze/shortness of breath/chest tightness.  May use 15-20 minutes prior to activity.   Monitor frequency of use.     Asthma control goals:   Full participation in all desired activities (may need albuterol before activity)  Albuterol use two time  or less a week on average (not counting use with activity)  Cough interfering with sleep two time or less a month  Oral steroids no more than once a year  No hospitalizations  Allergic rhinitis - Symptoms well controled with Montelukast as above - Let us know if allergy symptoms become more of an issue for you  Follow-up in a year or sooner if needed  I appreciate the opportunity to take part in Trevor's care. Please do not hesitate to contact me with questions.  Sincerely,   Margo Aye, MD Allergy/Immunology [facility]

## 2017-11-16 NOTE — Patient Instructions (Addendum)
  Asthma, mod persistent - very well controlled at this time  - Will wean down to Arnuity 100mcg 1 puff once a day  - If you are meeting the below control goals and not having any asthma related symptoms then in 3-4 months stop Arnuity.   - Continue Montekulast 10mg  at bedtime.   - Have access to albuterol inhaler 2 puffs every 4-6 hours as needed for cough/wheeze/shortness of breath/chest tightness.  May use 15-20 minutes prior to activity.   Monitor frequency of use.     Asthma control goals:   Full participation in all desired activities (may need albuterol before activity)  Albuterol use two time  or less a week on average (not counting use with activity)  Cough interfering with sleep two time or less a month  Oral steroids no more than once a year  No hospitalizations  Allergic rhinitis - Symptoms well controled with Montelukast as above - Let us know if allergy symptoms become more of an issue for you  Follow-up in a year or sooner if needed

## 2017-11-21 ENCOUNTER — Telehealth: Payer: Self-pay | Admitting: Allergy

## 2017-11-21 DIAGNOSIS — J3089 Other allergic rhinitis: Secondary | ICD-10-CM

## 2017-11-21 DIAGNOSIS — J4540 Moderate persistent asthma, uncomplicated: Secondary | ICD-10-CM

## 2017-11-21 MED ORDER — MONTELUKAST SODIUM 10 MG PO TABS: 10.0000 mg | ORAL_TABLET | Freq: Every day | ORAL | 2 refills | Status: DC

## 2017-11-21 NOTE — Telephone Encounter (Signed)
Prescription sent to pharmacy. Patient has not been notified just yet.

## 2017-11-21 NOTE — Telephone Encounter (Signed)
Patient needs refill on singulair - [facility]

## 2017-11-21 NOTE — Telephone Encounter (Signed)
Patient advised.

## 2017-11-30 ENCOUNTER — Telehealth: Payer: Self-pay | Admitting: Allergy

## 2017-11-30 NOTE — Telephone Encounter (Signed)
Dr Padgett please advise 

## 2017-11-30 NOTE — Telephone Encounter (Signed)
Patient was seen 11-16-17, by Dr. Delorse LekPadgett. [name] said she asked to be switched from her Virgel BouquetBreo because of cost. She was given Arnuity. She said it is not working and feels like her chest is tight, but she says she can manage as long as she stays in the house. [facility] and is [facility] [address]

## 2017-11-30 NOTE — Telephone Encounter (Signed)
Yes we were trying to wean her controller inhaler as she was under good control but appears that Arnuity is not controlling symptoms.   Thus let's resume Breo at the dose 1 puff a day and see if this controls her asthma symptoms better.   This would still be a step down in therapy from the Breo 200 she was previously using.   If she still feels symptoms with Breo 100 then next is to go back to the 200.  But lets see how she does on the Breo 100 dose.    Please refill her albuterol as well.

## 2017-12-01 MED ORDER — ALBUTEROL SULFATE HFA 108 (90 BASE) MCG/ACT IN AERS: INHALATION_SPRAY | RESPIRATORY_TRACT | 1 refills | Status: DC

## 2017-12-01 MED ORDER — FLUTICASONE FUROATE-VILANTEROL 100-25 MCG/INH IN AEPB: 1.0000 | INHALATION_SPRAY | Freq: Every day | RESPIRATORY_TRACT | 1 refills | Status: DC

## 2017-12-01 NOTE — Telephone Encounter (Signed)
Prescriptions have been sent. I let patient know to call us back and let us know when she was ready for the prescriptions to go to [facility]. She thanked me and advised she will call back when ready.

## 2017-12-01 NOTE — Addendum Note (Signed)
Addended by: Mliss FritzBLACK, KAYLA I on: 12/01/2017 10:10 AM   Modules accepted: Orders

## 2017-12-01 NOTE — Telephone Encounter (Signed)
Patient is calling back to see if her [facility]. She also needs her albuterol sent in with the breo  Please Advise

## 2017-12-18 ENCOUNTER — Other Ambulatory Visit: Payer: Self-pay

## 2017-12-18 MED ORDER — FLUTICASONE FUROATE-VILANTEROL 100-25 MCG/INH IN AEPB: 1.0000 | INHALATION_SPRAY | Freq: Every day | RESPIRATORY_TRACT | 1 refills | Status: DC

## 2017-12-18 NOTE — Telephone Encounter (Signed)
Pt called stating her Rx Breo 100 was sent to [facility] and needed [facility]. Reordered [facility] to [facility].

## 2018-01-31 ENCOUNTER — Other Ambulatory Visit: Payer: Self-pay | Admitting: Otolaryngology

## 2018-01-31 ENCOUNTER — Other Ambulatory Visit (HOSPITAL_COMMUNITY): Payer: Self-pay | Admitting: Otolaryngology

## 2018-01-31 DIAGNOSIS — R430 Anosmia: Secondary | ICD-10-CM

## 2018-02-05 ENCOUNTER — Ambulatory Visit (HOSPITAL_COMMUNITY): Payer: BC Managed Care – PPO

## 2018-02-14 ENCOUNTER — Telehealth: Payer: Self-pay

## 2018-02-14 MED ORDER — FLUTICASONE FUROATE-VILANTEROL 100-25 MCG/INH IN AEPB: 1.0000 | INHALATION_SPRAY | Freq: Every day | RESPIRATORY_TRACT | 5 refills | Status: DC

## 2018-02-14 NOTE — Telephone Encounter (Signed)
Received fax for refill for Breo 100. Patient was last seen 11/16/2017 and is to return in a year. Rx sent in.

## 2018-02-28 ENCOUNTER — Ambulatory Visit (HOSPITAL_COMMUNITY)
Admission: RE | Admit: 2018-02-28 | Discharge: 2018-02-28 | Disposition: A | Discharge status: Home / Self Care | Payer: BC Managed Care – PPO | Source: Ambulatory Visit | Attending: Otolaryngology | Admitting: Otolaryngology

## 2018-02-28 DIAGNOSIS — R430 Anosmia: Secondary | ICD-10-CM | POA: Insufficient documentation

## 2018-02-28 DIAGNOSIS — J329 Chronic sinusitis, unspecified: Secondary | ICD-10-CM | POA: Diagnosis not present

## 2018-02-28 MED ORDER — GADOBENATE DIMEGLUMINE 529 MG/ML IV SOLN
15.0000 mL | Freq: Once | INTRAVENOUS | Status: AC
  Administered 2018-02-28: 13 mL via INTRAVENOUS

## 2018-03-23 ENCOUNTER — Ambulatory Visit: Payer: BC Managed Care – PPO | Admitting: Allergy

## 2018-05-03 ENCOUNTER — Ambulatory Visit: Payer: BC Managed Care – PPO | Admitting: Allergy

## 2018-05-03 ENCOUNTER — Encounter: Payer: Self-pay | Admitting: Allergy

## 2018-05-03 VITALS — BP 110/70 | HR 70 | Resp 17 | Ht 64.0 in | Wt 151.4 lb

## 2018-05-03 DIAGNOSIS — J339 Nasal polyp, unspecified: Secondary | ICD-10-CM

## 2018-05-03 DIAGNOSIS — J4540 Moderate persistent asthma, uncomplicated: Secondary | ICD-10-CM

## 2018-05-03 DIAGNOSIS — J3089 Other allergic rhinitis: Secondary | ICD-10-CM

## 2018-05-03 MED ORDER — FLUTICASONE PROPIONATE 93 MCG/ACT NA EXHU: 2.0000 | INHALANT_SUSPENSION | Freq: Two times a day (BID) | NASAL | 5 refills | Status: DC

## 2018-05-03 MED ORDER — EPINEPHRINE 0.3 MG/0.3ML IJ SOAJ: 0.3000 mg | Freq: Once | INTRAMUSCULAR | 2 refills | Status: AC

## 2018-05-03 NOTE — Patient Instructions (Addendum)
Chronic (allergic) rhinosinusitis with nasal polyps - at this time no sense of smell or taste with polyps - continue Montelukast as above - to help shrink nasal polyps take prednisone 30mg  twice a day for next 5 day - change flonase to Xhance nasal device (which is flonase in specialized device that allows for deeper deposition of medication into the sinuses).  Use 2 sprays twice a day - will start approval process for Dupixent.  Protocol, benefits and risk discussed.  Will prescribe Auviq to have on hand incase of allergic reaction.  Tammy, our biologics nurse, will be in contact with you regarding approval.    Asthma, mod persistent - very well controlled at this time  - Continue Breo 1 puff once a day  - Continue Montekulast 10mg  at bedtime.   - Have access to albuterol inhaler 2 puffs every 4-6 hours as needed for cough/wheeze/shortness of breath/chest tightness.  May use 15-20 minutes prior to activity.   Monitor frequency of use.     Asthma control goals:   Full participation in all desired activities (may need albuterol before activity)  Albuterol use two time  or less a week on average (not counting use with activity)  Cough interfering with sleep two time or less a month  Oral steroids no more than once a year  No hospitalizations  Follow-up in a 4-6 months or sooner if needed

## 2018-05-03 NOTE — Progress Notes (Signed)
Follow-up Note  RE: Jessica Clark MRN: [id number] DOB: 13-Nov-1965 Date of Office Visit: 05/03/2018   History of present illness: Jessica Clark is a 52 y.o. female presenting today for follow-up of asthma, allergies and to discuss nasal polyps.  She was last seen November 16, 2017 by myself.  At this visit she states she was doing very well.  However today she states that she has been having no sense of smell or taste for the past year.  She does have a history of polyps and had polyp removal in [place] 2 years ago.  She has seen Dr. Haroldine Laws, ENT, since her last visit with me who diagnosed that her polyps have grown back.  She states she has not been able to smell or taste for the past year.  She is using Flonase 1 spray each nostril once a day.  She states that Dr. Haroldine Laws recommended she return back to see me for immunotherapy to help manage her polyps. With her asthma she has been doing well since she went back to [facility] 1 puff daily.  I did try to wean her down to Arnuity after her last visit however she had return of cough and wheeze and thus he went back to [facility] which she has been doing very well on.  She has not needed to use her albuterol since going back to [place].  She has not had any ED visits or urgent care visits or any oral steroid needs up to this point for her asthma. With her allergies besides not being able to smell or taste she denies any significant allergy symptoms.  She still continues on singular daily. She does have a history of RA and is on Humira injections but she states that she should do just fine with Dupixent injections.  Review of systems: Review of Systems  Constitutional: Negative for chills, fever and malaise/fatigue.  HENT: Positive for congestion. Negative for ear discharge, ear pain, nosebleeds, sinus pain, sore throat and tinnitus.   Eyes: Negative for pain, discharge and redness.  Respiratory: Negative for cough, shortness of breath and wheezing.     Cardiovascular: Negative for chest pain.  Gastrointestinal: Negative for abdominal pain, constipation, diarrhea, heartburn, nausea and vomiting.  Musculoskeletal: Negative for joint pain.  Skin: Negative for itching and rash.  Neurological: Negative for headaches.    All other systems negative unless noted above in HPI  Past medical/social/surgical/family history have been reviewed and are unchanged unless specifically indicated below.  No changes  Medication List: Allergies as of 05/03/2018      Reactions   Penicillins Other (See Comments)   Blisters in mouth      Medication List        Accurate as of 05/03/18  6:23 PM. Always use your most recent med list.          albuterol 108 (90 Base) MCG/ACT inhaler Commonly known as:  PROVENTIL HFA;VENTOLIN HFA Use 2 puffs every four hours as needed for cough or wheeze.  May use 2 puffs 10-20 minutes prior to exercise.   DIVIGEL 1 MG/GM Gel Generic drug:  Estradiol   fluticasone furoate-vilanterol 100-25 MCG/INH Aepb Commonly known as:  BREO ELLIPTA Inhale 1 puff into the lungs daily.   HUMIRA PEN 40 MG/0.4ML Pnkt Generic drug:  Adalimumab   montelukast 10 MG tablet Commonly known as:  SINGULAIR Take 1 tablet (10 mg total) by mouth daily.   progesterone 100 MG capsule Commonly known as:  PROMETRIUM Take 200 mg  by mouth daily.       Known medication allergies: Allergies  Allergen Reactions  . Penicillins Other (See Comments)    Blisters in mouth     Physical examination: Blood pressure 110/70, pulse 70, resp. rate 17, height 5\' 4"  (1.626 m), weight 151 lb 6.4 oz (68.7 kg), SpO2 98 %.  General: Alert, interactive, in no acute distress. HEENT: PERRLA, TMs pearly gray, turbinates moderately edematous without discharge with large polyps obstructing nasal passage bilaterally, post-pharynx non erythematous. Neck: Supple without lymphadenopathy. Lungs: Clear to auscultation without wheezing, rhonchi or rales. {no  increased work of breathing. CV: Normal S1, S2 without murmurs. Abdomen: Nondistended, nontender. Skin: Warm and dry, without lesions or rashes. Extremities:  No clubbing, cyanosis or edema. Neuro:   Grossly intact.  Diagnositics/Labs:  Spirometry: FEV1: 2.4L 92%, FVC: 2.94L 90%, ratio consistent with Nonobstructive pattern  Assessment and plan:   Chronic (allergic) rhinosinusitis with nasal polyps - at this time no sense of smell or taste with polyps - continue Montelukast as above - to help shrink nasal polyps take prednisone 30mg  twice a day for next 5 day - change flonase to Xhance nasal device (which is flonase in specialized device that allows for deeper deposition of medication into the sinuses).  Use 2 sprays twice a day - will start approval process for Dupixent.  Protocol, benefits and risk discussed.  Will prescribe Auviq to have on hand incase of allergic reaction.  Tammy, our biologics nurse, will be in contact with you regarding approval.    Asthma, mod persistent - very well controlled at this time  - Continue Breo 1 puff once a day  - Continue Montekulast 10mg  at bedtime.   - Have access to albuterol inhaler 2 puffs every 4-6 hours as needed for cough/wheeze/shortness of breath/chest tightness.  May use 15-20 minutes prior to activity.   Monitor frequency of use.     Asthma control goals:   Full participation in all desired activities (may need albuterol before activity)  Albuterol use two time  or less a week on average (not counting use with activity)  Cough interfering with sleep two time or less a month  Oral steroids no more than once a year  No hospitalizations  Follow-up in a 4-6 months or sooner if needed  I appreciate the opportunity to take part in Cicily's care. Please do not hesitate to contact me with questions.  Sincerely,   Margo Aye, MD Allergy/Immunology [facility]

## 2018-06-04 ENCOUNTER — Telehealth: Payer: Self-pay | Admitting: *Deleted

## 2018-06-04 NOTE — Telephone Encounter (Signed)
Patient received her first shipment of Dupixent today and I reviewed her dosing one injection every 14 days, no loading dose for nasal polyps. Patient understands and she already has epipen on hand.  Patient already knows how to self inject since she isI already on Humira so she will go ahead and start therapy.  I advised her if she has any questions to contact me or office.

## 2018-06-05 NOTE — Telephone Encounter (Signed)
Great, thanks Tammy.

## 2018-07-18 ENCOUNTER — Other Ambulatory Visit: Payer: Self-pay | Admitting: Allergy

## 2018-07-18 DIAGNOSIS — J4540 Moderate persistent asthma, uncomplicated: Secondary | ICD-10-CM

## 2018-07-18 DIAGNOSIS — J3089 Other allergic rhinitis: Secondary | ICD-10-CM

## 2018-08-13 ENCOUNTER — Other Ambulatory Visit: Payer: Self-pay | Admitting: Allergy

## 2018-10-16 ENCOUNTER — Other Ambulatory Visit: Payer: Self-pay | Admitting: Allergy

## 2018-10-16 DIAGNOSIS — J3089 Other allergic rhinitis: Secondary | ICD-10-CM

## 2018-10-16 DIAGNOSIS — J4540 Moderate persistent asthma, uncomplicated: Secondary | ICD-10-CM

## 2018-10-16 NOTE — Telephone Encounter (Signed)
Courtesy refill  

## 2018-11-09 ENCOUNTER — Encounter: Payer: Self-pay | Admitting: Family Medicine

## 2018-11-09 ENCOUNTER — Ambulatory Visit (INDEPENDENT_AMBULATORY_CARE_PROVIDER_SITE_OTHER): Payer: BC Managed Care – PPO | Admitting: Family Medicine

## 2018-11-09 ENCOUNTER — Other Ambulatory Visit: Payer: Self-pay

## 2018-11-09 VITALS — Ht 64.0 in | Wt 142.0 lb

## 2018-11-09 DIAGNOSIS — M0520 Rheumatoid vasculitis with rheumatoid arthritis of unspecified site: Secondary | ICD-10-CM | POA: Diagnosis not present

## 2018-11-09 DIAGNOSIS — J4540 Moderate persistent asthma, uncomplicated: Secondary | ICD-10-CM

## 2018-11-09 DIAGNOSIS — J339 Nasal polyp, unspecified: Secondary | ICD-10-CM | POA: Diagnosis not present

## 2018-11-09 DIAGNOSIS — J3089 Other allergic rhinitis: Secondary | ICD-10-CM

## 2018-11-09 NOTE — Progress Notes (Addendum)
RE: Jessica Clark MRN: [id number] DOB: Oct 14, 1965 Date of Telemedicine Visit: 11/09/2018  Referring provider: Juluis Rainier, MD Primary care provider: Juluis Rainier, MD  Chief Complaint: Asthma (dupixent renewal. patient states this is wonderful. )   Telemedicine Follow Up Visit via WebEx: I connected with Pasha Agers for a follow up on 11/09/18 by WebEx and verified that I am speaking with the correct person using two identifiers.   I discussed the limitations, risks, security and privacy concerns of performing an evaluation and management service by telemedicine and the availability of in person appointments. I also discussed with the patient that there may be a patient responsible charge related to this service. The patient expressed understanding and agreed to proceed.  Patient is at home.  Provider is at the office.  Visit start time: 8:48 Visit end time: 9:09 Insurance consent/check in by: Tobey Bride Medical consent and medical assistant/nurse: Mliss Fritz  History of Present Illness: She is a 53 y.o. female, who is being followed for asthma, allergic rhinitis, and nasal polyposis. She also has a history of rheumatoid arthritis for which she takes Humira which is managed by rheumatology. Her previous allergy office visit was on 05/03/2018 with Dr. Delorse Lek. At today's visit, she reports her nasal polyposis has been well controlled with her sense of smell and taste fully restored with the use of Dupixent 300 mg injection every 2 weeks and Xhance nasal spray 2 sprays in each nostril twice a day. She reports self injection at home as going well with no reactions or trouble injecting. Asthma is reported as well controlled with no shortness of breath, cough, or wheeze with activity or rest. She continues to use Breo Ellipta 1 puff once a day and rarely uses  her albuterol. Allergic rhinitis is reported as well controlled with Xhance and allergen avoidance measures. She does  not use an antihistamine at this time.  Assessment and Plan: Kierstyn is a 53 y.o. female with:  Chronic (allergic) rhinosinusitis with nasal polyps - at this time sense of smell and taste has been fully restored - continue Montelukast 10 mg once a day as needed - continue Xhance nasal device 2 sprays twice a day - continue Dupixent 300 mg injection once every 2 weeks   - continue allergen avoidance measures  Asthma, mod persistent - very well controlled at this time  - Continue Breo 1 puff once a day  - Continue Montekulast 10mg  at bedtime (as above)  - Have access to albuterol inhaler 2 puffs every 4-6 hours as needed for cough/wheeze/shortness of breath/chest tightness.  May use 15-20 minutes prior to activity.   Monitor frequency of use.     Asthma control goals:   Full participation in all desired activities (may need albuterol before activity)  Albuterol use two time  or less a week on average (not counting use with activity)  Cough interfering with sleep two time or less a month  Oral steroids no more than once a year  No hospitalizations   Follow-up in 6 months or sooner if needed  Return in about 6 months (around 05/11/2019), or if symptoms worsen or fail to improve.    Medication List:  Current Outpatient Medications  Medication Sig Dispense Refill  . albuterol (PROAIR HFA) 108 (90 Base) MCG/ACT inhaler Use 2 puffs every four hours as needed for cough or wheeze.  May use 2 puffs 10-20 minutes prior to exercise. 1 Inhaler 1  . DIVIGEL 1 MG/GM GEL     .  Dupilumab (DUPIXENT) 300 MG/2ML SOSY Inject 2 mLs into the skin every 14 (fourteen) days.    Marland Kitchen eculizumab (SOLIRIS) 300 MG/30ML SOLN injection Inject 300 mg into the vein every 8 (eight) weeks.    . fluticasone furoate-vilanterol (BREO ELLIPTA) 100-25 MCG/INH AEPB INHALE 1 INHALATION INTO   THE LUNGS DAILY 180 each 0  . Fluticasone Propionate (XHANCE) 93 MCG/ACT EXHU Place 2 sprays into the nose 2 (two) times  daily. 16 mL 5  . montelukast (SINGULAIR) 10 MG tablet TAKE 1 TABLET DAILY 90 tablet 0  . progesterone (PROMETRIUM) 100 MG capsule Take 200 mg by mouth daily.      Current Facility-Administered Medications  Medication Dose Route Frequency Provider Last Rate Last Dose  . Mepolizumab SOLR 100 mL  100 mL Subcutaneous Q28 days Jessica Priest, MD   100 mL at 01/30/17 1002   Allergies: Allergies  Allergen Reactions  . Penicillins Other (See Comments)    Blisters in mouth   I reviewed her past medical history, social history, family history, and environmental history and no significant changes have been reported from previous visit on 05/03/2018.  Review of Systems  Constitutional: Negative.   HENT: Negative.   Eyes: Negative.   Respiratory: Negative.   Cardiovascular: Negative.   Musculoskeletal: Negative.   Neurological: Negative.   Psychiatric/Behavioral: Negative.   All other systems reviewed and are negative.  Objective: Physical Exam  Constitutional: She is oriented to person, place, and time. She appears well-developed and well-nourished.  HENT:  Head: Normocephalic.  Eyes: Conjunctivae are normal.  Musculoskeletal: Normal range of motion.  Neurological: She is oriented to person, place, and time.  Psychiatric: She has a normal mood and affect. Her behavior is normal. Judgment and thought content normal.   Limited as encounter was done via WebEx.  Previous notes and tests were reviewed.  I discussed the assessment and treatment plan with the patient. The patient was provided an opportunity to ask questions and all were answered. The patient agreed with the plan and demonstrated an understanding of the instructions.   The patient was advised to call back or seek an in-person evaluation if the symptoms worsen or if the condition fails to improve as anticipated.  I provided 21 minutes of video-face-to-face time during this encounter.  It was my pleasure to participate in  [place] Brandl's care today. Please feel free to contact me with any questions or concerns.   Sincerely,  Thermon Leyland, FNP  Attestation: I reviewed the Nurse Practitioner's note and agree with the documented findings and plan of care. We discussed the patient and developed a plan concurrently.   Margo Aye, MD [facility]

## 2018-11-09 NOTE — Patient Instructions (Addendum)
Chronic (allergic) rhinosinusitis with nasal polyps - at this time sense of smell and taste has been fully restored - continue Montelukast 10 mg once a day as needed - continue Xhance nasal device 2 sprays twice a day - continue Dupixent 300 mg injection once every 2 weeks   - continue allergen avoidance measures  Asthma, mod persistent - very well controlled at this time  - Continue Breo 1 puff once a day  - Continue Montekulast 10mg  at bedtime (as above)  - Have access to albuterol inhaler 2 puffs every 4-6 hours as needed for cough/wheeze/shortness of breath/chest tightness.  May use 15-20 minutes prior to activity.   Monitor frequency of use.     Asthma control goals:   Full participation in all desired activities (may need albuterol before activity)  Albuterol use two time  or less a week on average (not counting use with activity)  Cough interfering with sleep two time or less a month  Oral steroids no more than once a year  No hospitalizations   Follow-up in 6 months or sooner if needed

## 2018-11-09 NOTE — Progress Notes (Signed)
START OF VISIT:848am CONSENT FOR VISIT:verbal consent given by patient  PLACE OF VISIT: Home END OF VISIT:908 am

## 2018-11-11 ENCOUNTER — Other Ambulatory Visit: Payer: Self-pay | Admitting: Allergy

## 2018-11-12 ENCOUNTER — Other Ambulatory Visit: Payer: Self-pay

## 2018-11-12 ENCOUNTER — Telehealth: Payer: Self-pay

## 2018-11-12 MED ORDER — FLUTICASONE PROPIONATE 93 MCG/ACT NA EXHU: 2.0000 | INHALANT_SUSPENSION | Freq: Two times a day (BID) | NASAL | 11 refills | Status: DC

## 2018-11-12 NOTE — Telephone Encounter (Signed)
Patient called to get a refill on her xhance.  Please Advise.

## 2018-11-26 ENCOUNTER — Other Ambulatory Visit: Payer: Self-pay | Admitting: Allergy

## 2018-11-26 MED ORDER — FLUTICASONE FUROATE-VILANTEROL 100-25 MCG/INH IN AEPB: INHALATION_SPRAY | RESPIRATORY_TRACT | 1 refills | Status: DC

## 2018-11-26 NOTE — Telephone Encounter (Signed)
Patient is requesting a 90 supply of Breo. [facility].

## 2018-11-26 NOTE — Telephone Encounter (Signed)
90day supply sent.

## 2019-02-03 ENCOUNTER — Other Ambulatory Visit: Payer: Self-pay | Admitting: Allergy

## 2019-02-03 DIAGNOSIS — J4540 Moderate persistent asthma, uncomplicated: Secondary | ICD-10-CM

## 2019-02-03 DIAGNOSIS — J3089 Other allergic rhinitis: Secondary | ICD-10-CM

## 2019-03-05 ENCOUNTER — Other Ambulatory Visit: Payer: Self-pay | Admitting: Allergy

## 2019-03-19 NOTE — Telephone Encounter (Signed)
Please advise 

## 2019-04-25 ENCOUNTER — Other Ambulatory Visit: Payer: Self-pay | Admitting: *Deleted

## 2019-04-25 DIAGNOSIS — Z20822 Contact with and (suspected) exposure to covid-19: Secondary | ICD-10-CM

## 2019-04-27 LAB — NOVEL CORONAVIRUS, NAA: SARS-CoV-2, NAA: NOT DETECTED

## 2019-04-28 ENCOUNTER — Other Ambulatory Visit: Payer: Self-pay | Admitting: Allergy

## 2019-04-28 DIAGNOSIS — J4540 Moderate persistent asthma, uncomplicated: Secondary | ICD-10-CM

## 2019-04-28 DIAGNOSIS — J3089 Other allergic rhinitis: Secondary | ICD-10-CM

## 2019-05-07 ENCOUNTER — Other Ambulatory Visit: Payer: Self-pay | Admitting: Family Medicine

## 2019-05-07 NOTE — Telephone Encounter (Signed)
Gave 1 refill of breo. Pt needs ov for additional refills.

## 2019-05-31 ENCOUNTER — Other Ambulatory Visit: Payer: Self-pay | Admitting: Family Medicine

## 2019-06-30 ENCOUNTER — Other Ambulatory Visit: Payer: Self-pay | Admitting: Family Medicine

## 2019-07-30 ENCOUNTER — Other Ambulatory Visit: Payer: Self-pay | Admitting: Family Medicine

## 2019-08-26 ENCOUNTER — Telehealth: Payer: Self-pay

## 2019-08-26 MED ORDER — BREO ELLIPTA 100-25 MCG/INH IN AEPB: INHALATION_SPRAY | RESPIRATORY_TRACT | 0 refills | Status: DC

## 2019-08-26 NOTE — Telephone Encounter (Signed)
breo was declined. Pt. Needed office visit in October. If pt. Calls office and schedules an appointment we will call in a courtesy refill.

## 2019-08-26 NOTE — Telephone Encounter (Signed)
Spoke to patient and she scheduled an appointment, I will put a refill into her pharmacy.

## 2019-09-06 ENCOUNTER — Other Ambulatory Visit: Payer: Self-pay | Admitting: Allergy

## 2019-09-06 DIAGNOSIS — J3089 Other allergic rhinitis: Secondary | ICD-10-CM

## 2019-09-06 DIAGNOSIS — J4540 Moderate persistent asthma, uncomplicated: Secondary | ICD-10-CM

## 2019-09-06 MED ORDER — MONTELUKAST SODIUM 10 MG PO TABS: 10.0000 mg | ORAL_TABLET | Freq: Every day | ORAL | 0 refills | Status: DC

## 2019-09-06 NOTE — Telephone Encounter (Signed)
Called and left a message for patient informing her that unfortunately we can not refill her medication request at this time. I informed patient that she has not been seen since 11/09/2018 and was to return in 6 months or sooner. I also informed patient that she received a 90 day courtesy refill on 04/28/2019 and will need to keep her OV for refills.

## 2019-09-06 NOTE — Telephone Encounter (Signed)
Spoke with patient and she informed me that she has an Office visit next month and would like to know if we could send in enough to get her through to her appointment. Informed patient that I would be able to send in a 30 day supply instead of 90 to get her through until her appointment. I did inform patient to keep that appointment and more refills will be sent the day of her visit.

## 2019-09-06 NOTE — Telephone Encounter (Signed)
Patient is requesting a refill for Montelukast, 3 month supply. Last seen 11/09/2018 by Thurston Hole. Thurston Hole ask her to return in about 6 months (05/11/2019) or sooner if needed. Patient has an appointment scheduled for 09/26/2019 with Dr. Delorse Lek. She called her pharmacy and they ask her to contact us. [facility].

## 2019-09-09 ENCOUNTER — Other Ambulatory Visit: Payer: Self-pay

## 2019-09-09 DIAGNOSIS — J3089 Other allergic rhinitis: Secondary | ICD-10-CM

## 2019-09-09 DIAGNOSIS — J4540 Moderate persistent asthma, uncomplicated: Secondary | ICD-10-CM

## 2019-09-19 ENCOUNTER — Other Ambulatory Visit: Payer: Self-pay | Admitting: Allergy and Immunology

## 2019-09-25 ENCOUNTER — Telehealth: Payer: Self-pay | Admitting: Allergy

## 2019-09-25 DIAGNOSIS — J3089 Other allergic rhinitis: Secondary | ICD-10-CM

## 2019-09-25 DIAGNOSIS — J4540 Moderate persistent asthma, uncomplicated: Secondary | ICD-10-CM

## 2019-09-25 MED ORDER — BREO ELLIPTA 100-25 MCG/INH IN AEPB: INHALATION_SPRAY | RESPIRATORY_TRACT | 0 refills | Status: DC

## 2019-09-25 MED ORDER — MONTELUKAST SODIUM 10 MG PO TABS: 10.0000 mg | ORAL_TABLET | Freq: Every day | ORAL | 0 refills | Status: DC

## 2019-09-25 NOTE — Telephone Encounter (Signed)
We called patient about tomorrow and we reschedule on March 25 and needs refills for breo, [facility]ulair. [phone]. Also needs to have her Dupixent refill too.

## 2019-09-26 ENCOUNTER — Ambulatory Visit: Payer: BC Managed Care – PPO | Admitting: Allergy

## 2019-10-01 ENCOUNTER — Other Ambulatory Visit: Payer: Self-pay | Admitting: Allergy

## 2019-10-01 DIAGNOSIS — J4540 Moderate persistent asthma, uncomplicated: Secondary | ICD-10-CM

## 2019-10-01 DIAGNOSIS — J3089 Other allergic rhinitis: Secondary | ICD-10-CM

## 2019-10-16 MED ORDER — DUPIXENT 300 MG/2ML ~~LOC~~ SOSY: PREFILLED_SYRINGE | SUBCUTANEOUS | 10 refills | Status: DC

## 2019-10-16 NOTE — Telephone Encounter (Signed)
Refill sent will go ahead with Ins reapproval

## 2019-10-16 NOTE — Addendum Note (Signed)
Addended by: Devoria Glassing on: 10/16/2019 09:30 AM   Modules accepted: Orders

## 2019-10-24 ENCOUNTER — Other Ambulatory Visit: Payer: Self-pay | Admitting: Allergy

## 2019-10-24 DIAGNOSIS — J3089 Other allergic rhinitis: Secondary | ICD-10-CM

## 2019-10-24 DIAGNOSIS — J4540 Moderate persistent asthma, uncomplicated: Secondary | ICD-10-CM

## 2019-10-31 ENCOUNTER — Ambulatory Visit (INDEPENDENT_AMBULATORY_CARE_PROVIDER_SITE_OTHER): Payer: BC Managed Care – PPO | Admitting: Allergy

## 2019-10-31 ENCOUNTER — Telehealth: Payer: Self-pay

## 2019-10-31 ENCOUNTER — Other Ambulatory Visit: Payer: Self-pay

## 2019-10-31 ENCOUNTER — Encounter: Payer: Self-pay | Admitting: Allergy

## 2019-10-31 VITALS — BP 138/76 | HR 80 | Temp 98.0°F | Ht 64.0 in | Wt 156.4 lb

## 2019-10-31 DIAGNOSIS — J3089 Other allergic rhinitis: Secondary | ICD-10-CM

## 2019-10-31 DIAGNOSIS — J4540 Moderate persistent asthma, uncomplicated: Secondary | ICD-10-CM | POA: Diagnosis not present

## 2019-10-31 DIAGNOSIS — J339 Nasal polyp, unspecified: Secondary | ICD-10-CM | POA: Diagnosis not present

## 2019-10-31 MED ORDER — BREO ELLIPTA 100-25 MCG/INH IN AEPB: 1.0000 | INHALATION_SPRAY | Freq: Every day | RESPIRATORY_TRACT | 5 refills | Status: DC

## 2019-10-31 MED ORDER — MONTELUKAST SODIUM 10 MG PO TABS: 10.0000 mg | ORAL_TABLET | Freq: Every day | ORAL | 5 refills | Status: DC

## 2019-10-31 MED ORDER — ALBUTEROL SULFATE HFA 108 (90 BASE) MCG/ACT IN AERS: INHALATION_SPRAY | RESPIRATORY_TRACT | 3 refills | Status: DC

## 2019-10-31 NOTE — Patient Instructions (Signed)
Chronic (allergic) rhinosinusitis with nasal polyps - at this time sense of smell and taste is good as well as a no issues with nasal obstruction! - continue Montelukast 10 mg once a day as needed - we discussed today Dupixent use.  I think it is reasonable at this time since she is under good control to see how she does at monthly injections.  This also will help [place] determine if the cold sores that she has been developing is related to the frequent Dupixent use or not.  She would like to try the monthly dosing and see how this effects that before potentially trying different agents.  We did however discussed that Xolair now has approval for nasal polyps and if need be we could change to this option in the future.   - continue allergen avoidance measures  Asthma, mod persistent - very well controlled at this time  - Continue Breo 1 puff once a day  - Continue Montekulast 10mg  at bedtime (as above)  - Have access to albuterol inhaler 2 puffs every 4-6 hours as needed for cough/wheeze/shortness of breath/chest tightness.  May use 15-20 minutes prior to activity.   Monitor frequency of use.     Asthma control goals:   Full participation in all desired activities (may need albuterol before activity)  Albuterol use two time  or less a week on average (not counting use with activity)  Cough interfering with sleep two time or less a month  Oral steroids no more than once a year  No hospitalizations   Follow-up in 6-12 months or sooner if needed

## 2019-10-31 NOTE — Progress Notes (Signed)
Follow-up Note  RE: Jessica Clark MRN: [id number] DOB: 1966-05-18 Date of Office Visit: 10/31/2019   History of present illness: Jessica Clark is a 54 y.o. female presenting today for follow-up of chronic rhinosinusitis with nasal polyps as well as asthma.  Her last visit was a telemedicine visit on November 09, 2018 by myself.  She states she has been doing well since her last visit.  She states with her Dupixent she can still smell and taste and breathe through her nose but she is very pleased about.  She is doing Dupixent every 2 weeks.  However she states for lately she has been having a cold sores on her mouth and has read from the side effect profile that this could be related to [place] use.  She however is very pleased with her polyp control.  She however does want to see if she could have the continue control with every 4-week dosing instead of 2-week dosing.  She has used [facility] in the past for polyp control but is currently not using. She also states that her asthma has been under good control as well she has not had any issues or need to use her rescue medication.  She does take Breo 101 puff daily as well as Singulair daily.   Review of systems: Review of Systems  Constitutional: Negative.   HENT: Negative.   Eyes: Negative.   Respiratory: Negative.   Cardiovascular: Negative.   Gastrointestinal: Negative.   Musculoskeletal: Negative.   Skin: Negative.   Neurological: Negative.     All other systems negative unless noted above in HPI  Past medical/social/surgical/family history have been reviewed and are unchanged unless specifically indicated below.  No changes  Medication List: Current Outpatient Medications  Medication Sig Dispense Refill  . albuterol (PROAIR HFA) 108 (90 Base) MCG/ACT inhaler Use 2 puffs every four hours as needed for cough or wheeze.  May use 2 puffs 10-20 minutes prior to exercise. 1 Inhaler 1  . DIVIGEL 1 MG/GM GEL     . dupilumab  (DUPIXENT) 300 MG/2ML prefilled syringe INJECT 300 MG SUBCUTANEOUSLY EVERY OTHER WEEK 4 mL 10  . eculizumab (SOLIRIS) 300 MG/30ML SOLN injection Inject 300 mg into the vein every 8 (eight) weeks.    . fluticasone furoate-vilanterol (BREO ELLIPTA) 100-25 MCG/INH AEPB USE 1 INHALATION ORALLY                  INTO THE LUNGS DAILY.                    PLEASE MAKE AN APPOINTMENT               WITH YOUR DOCTOR (NEEDS OFFICE VISIT FOR ADDITIONAL REFILLS) 28 each 0  . montelukast (SINGULAIR) 10 MG tablet TAKE 1 TABLET BY MOUTH EVERY DAY 30 tablet 0  . progesterone (PROMETRIUM) 100 MG capsule Take 200 mg by mouth daily.     . Fluticasone Propionate (XHANCE) 93 MCG/ACT EXHU Place 2 sprays into the nose 2 (two) times daily. (Patient not taking: Reported on 10/31/2019) 32 mL 11   Current Facility-Administered Medications  Medication Dose Route Frequency Provider Last Rate Last Admin  . Mepolizumab SOLR 100 mL  100 mL Subcutaneous Q28 days Jiles Prows, MD   100 mL at 01/30/17 1002     Known medication allergies: Allergies  Allergen Reactions  . Penicillins Other (See Comments)    Blisters in mouth     Physical examination: Blood pressure 138/76,  pulse 80, temperature 98 F (36.7 C), temperature source Temporal, height 5\' 4"  (1.626 m), weight 156 lb 6.4 oz (70.9 kg), SpO2 97 %.  General: Alert, interactive, in no acute distress. HEENT: PERRLA, TMs pearly gray, turbinates non-edematous without discharge, post-pharynx non erythematous.  No visible polyps Neck: Supple without lymphadenopathy. Lungs: Clear to auscultation without wheezing, rhonchi or rales. {no increased work of breathing. CV: Normal S1, S2 without murmurs. Abdomen: Nondistended, nontender. Skin: Warm and dry, without lesions or rashes. Extremities:  No clubbing, cyanosis or edema. Neuro:   Grossly intact.  Diagnositics/Labs:  Spirometry: FEV1: 2.54L 93%, FVC: 3.21L 92%, ratio consistent with Nonobstructive pattern  Assessment  and plan:   Chronic (allergic) rhinosinusitis with nasal polyps - at this time sense of smell and taste is good as well as a no issues with nasal obstruction! - continue Montelukast 10 mg once a day as needed - we discussed today Dupixent use.  I think it is reasonable at this time since she is under good control to see how she does at monthly injections.  This also will help determine if the cold sores that she has been developing is related to the frequent Dupixent use or not.  She would like to try the monthly dosing and see how this effects that before potentially trying different agents.  We did however discussed that Xolair now has approval for nasal polyps and if need be we could change to this option in the future.   - continue allergen avoidance measures  Asthma, mod persistent - very well controlled at this time  - Continue Breo [place] 1 puff once a day  - Continue Montekulast 10mg  at bedtime (as above)  - Have access to albuterol inhaler 2 puffs every 4-6 hours as needed for cough/wheeze/shortness of breath/chest tightness.  May use 15-20 minutes prior to activity.   Monitor frequency of use.     Asthma control goals:   Full participation in all desired activities (may need albuterol before activity)  Albuterol use two time  or less a week on average (not counting use with activity)  Cough interfering with sleep two time or less a month  Oral steroids no more than once a year  No hospitalizations   Follow-up in 6-12 months or sooner if needed   I appreciate the opportunity to take part in Jadon's care. Please do not hesitate to contact me with questions.  Sincerely,   , MD Allergy/Immunology [facility]

## 2019-11-01 ENCOUNTER — Other Ambulatory Visit: Payer: Self-pay | Admitting: *Deleted

## 2019-11-01 NOTE — Telephone Encounter (Signed)
Called and advised refill sent and approval done for Dupixent

## 2019-11-11 NOTE — Telephone Encounter (Signed)
Opened in error

## 2019-12-02 ENCOUNTER — Other Ambulatory Visit: Payer: Self-pay | Admitting: *Deleted

## 2019-12-02 DIAGNOSIS — J3089 Other allergic rhinitis: Secondary | ICD-10-CM

## 2019-12-02 DIAGNOSIS — J4540 Moderate persistent asthma, uncomplicated: Secondary | ICD-10-CM

## 2019-12-02 MED ORDER — MONTELUKAST SODIUM 10 MG PO TABS
10.0000 mg | ORAL_TABLET | Freq: Every day | ORAL | 1 refills | Status: DC
Start: 2019-12-02 — End: 2019-12-03

## 2019-12-03 ENCOUNTER — Other Ambulatory Visit: Payer: Self-pay | Admitting: *Deleted

## 2019-12-03 DIAGNOSIS — J3089 Other allergic rhinitis: Secondary | ICD-10-CM

## 2019-12-03 DIAGNOSIS — J4540 Moderate persistent asthma, uncomplicated: Secondary | ICD-10-CM

## 2019-12-03 MED ORDER — MONTELUKAST SODIUM 10 MG PO TABS: 10.0000 mg | ORAL_TABLET | Freq: Every day | ORAL | 1 refills | Status: DC

## 2019-12-09 ENCOUNTER — Other Ambulatory Visit: Payer: Self-pay | Admitting: Allergy

## 2019-12-09 DIAGNOSIS — J3089 Other allergic rhinitis: Secondary | ICD-10-CM

## 2019-12-09 DIAGNOSIS — J4540 Moderate persistent asthma, uncomplicated: Secondary | ICD-10-CM

## 2020-03-17 ENCOUNTER — Other Ambulatory Visit: Payer: Self-pay | Admitting: Allergy

## 2020-03-17 DIAGNOSIS — J4540 Moderate persistent asthma, uncomplicated: Secondary | ICD-10-CM

## 2020-03-17 DIAGNOSIS — J3089 Other allergic rhinitis: Secondary | ICD-10-CM

## 2020-03-29 ENCOUNTER — Other Ambulatory Visit: Payer: Self-pay | Admitting: Allergy

## 2020-05-01 IMAGING — MR MR HEAD WO/W CM
11 of 13 series · 38 of 48 positions shown · IV contrast (multihance)
Comparison: None.

CLINICAL DATA: Anosmia and decreased taste sensation for 8 months.
History of polyposis, psoriasis and rheumatoid arthritis.

EXAM:
MRI HEAD WITHOUT AND WITH CONTRAST
TECHNIQUE: Multiplanar, multiecho pulse sequences of the brain and surrounding
structures were obtained without and with intravenous contrast.
CONTRAST:  13mL MULTIHANCE GADOBENATE DIMEGLUMINE 529 MG/ML IV SOLN

[Series 5: ax dwi_tracew · axial · 3.0mm · 1.44mm/px · z∈[-41,+94]mm · 7 of 80 slices shown]
[im 1/80]
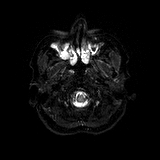
[im 14/80]
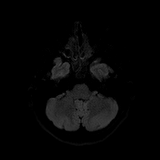
[im 27/80]
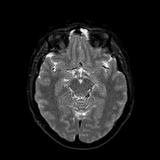
[im 40/80]
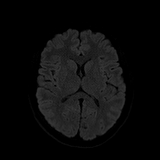
[im 53/80]
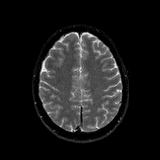
[im 66/80]
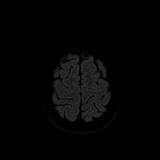
[im 80/80]
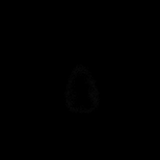

[Series 6: ax dwi_adc · axial · 3.0mm · 1.44mm/px · z∈[-41,+94]mm · 3 of 40 slices shown]
[im 1/40]
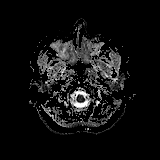
[im 20/40]
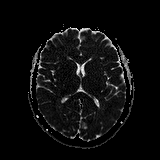
[im 40/40]
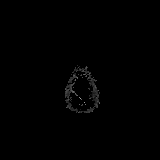

[Series 7: T1 · sagittal · 5.0mm · 0.72mm/px · 2 of 31 slices shown]
[im 1/31]
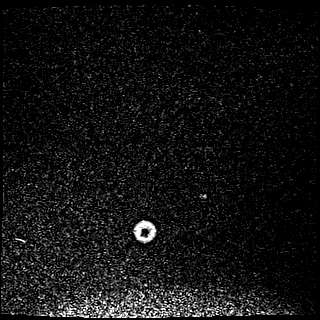
[im 31/31]
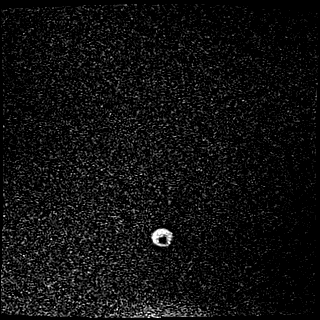

[Series 8: T2 · axial · 4.0mm · 0.72mm/px · z∈[-53,+82]mm · 3 of 28 slices shown]
[im 1/28]
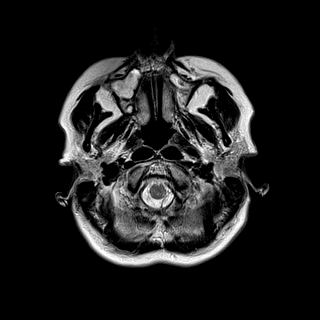
[im 14/28]
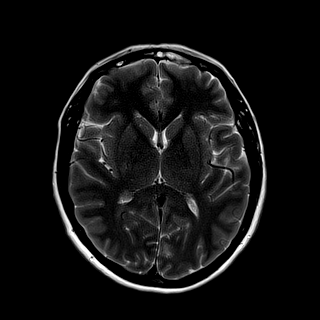
[im 28/28]
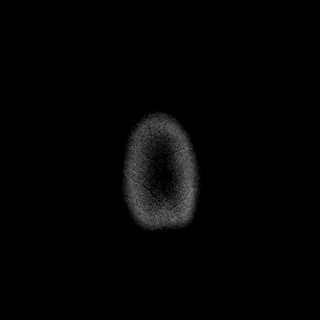

[Series 9: swi_images · axial · 3.0mm · 0.90mm/px · z∈[-58,+83]mm · 5 of 48 slices shown]
[im 1/48]
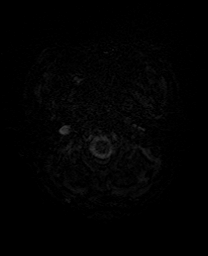
[im 12/48]
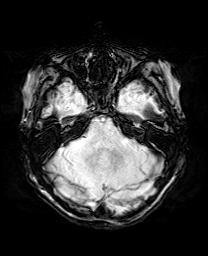
[im 24/48]
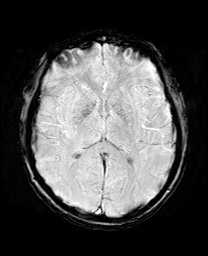
[im 36/48]
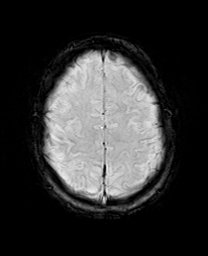
[im 48/48]
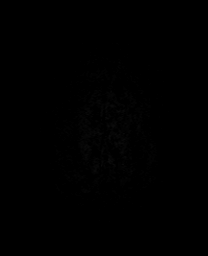

[Series 10: mip_images(sw) · axial · 24.0mm · 0.90mm/px · z∈[-47,+72]mm · 4 of 41 slices shown]
[im 1/41]
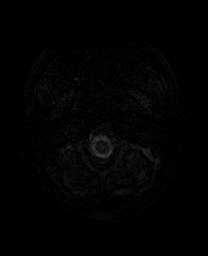
[im 14/41]
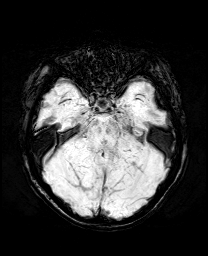
[im 27/41]
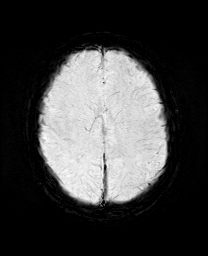
[im 41/41]
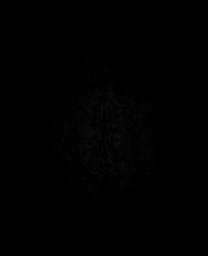

[Series 11: FLAIR · axial · 4.0mm · 0.45mm/px · z∈[-52,+82]mm · 3 of 28 slices shown]
[im 1/28]
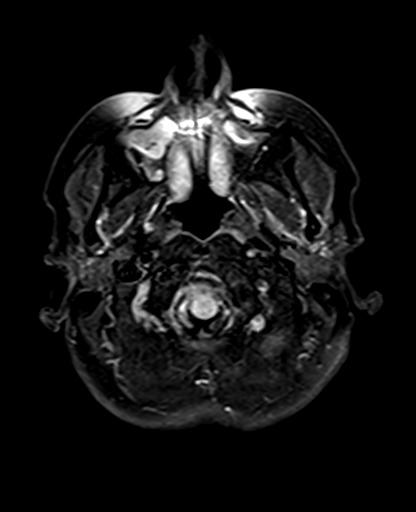
[im 14/28]
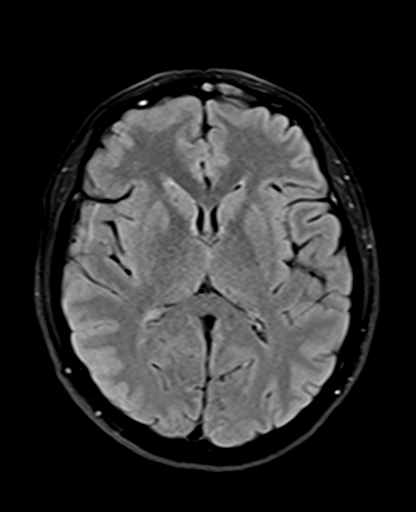
[im 28/28]
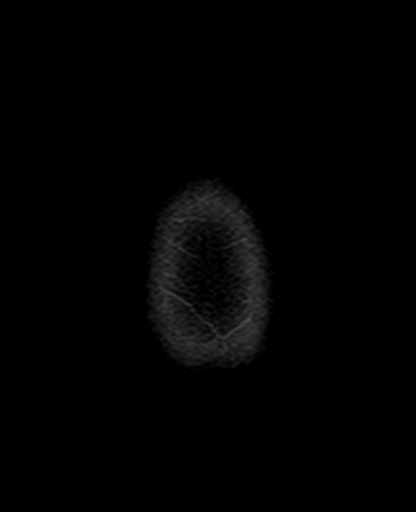

[Series 15: T2 fat-sat · axial · 3.0mm · 0.57mm/px · z∈[-61,+27]mm · 2 of 22 slices shown (1 of 2)]
[im 1/22]
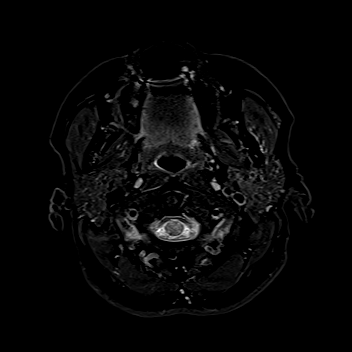
[im 22/22]
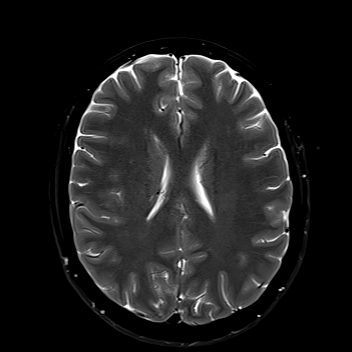

[Series 18: T2 fat-sat · coronal · 3.0mm · 0.54mm/px · 3 of 27 slices shown (2 of 2)]
[im 1/27]
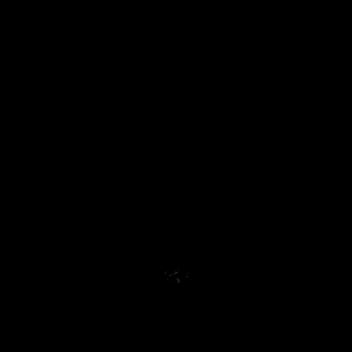
[im 14/27]
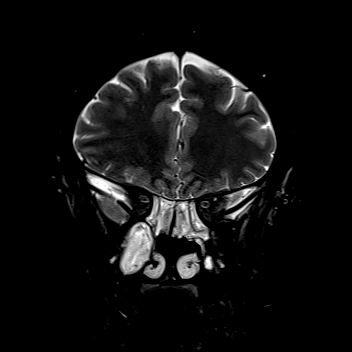
[im 27/27]
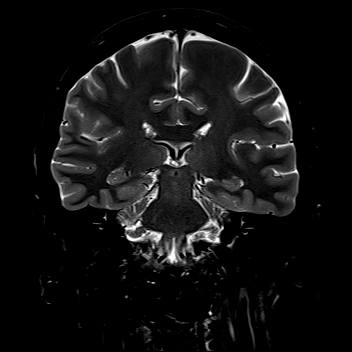

[Series 19: T2 post-contrast · coronal · 5.0mm · 0.72mm/px · 3 of 26 slices shown]
[im 1/26]
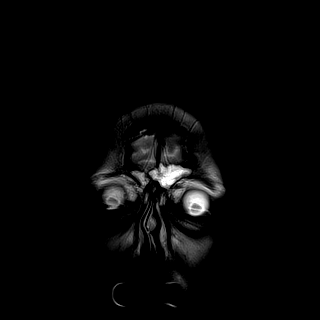
[im 13/26]
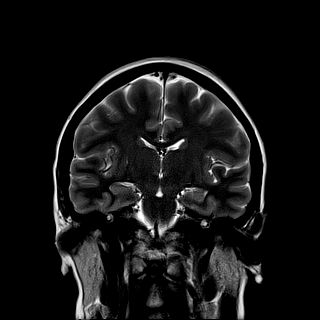
[im 26/26]
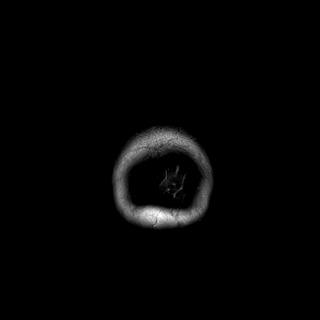

[Series 21: T1 post-contrast · coronal · 5.0mm · 0.34mm/px · 3 of 26 slices shown]
[im 1/26]
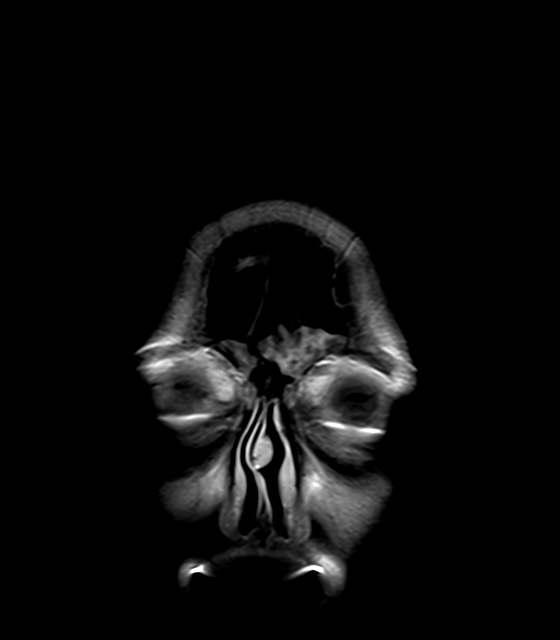
[im 13/26]
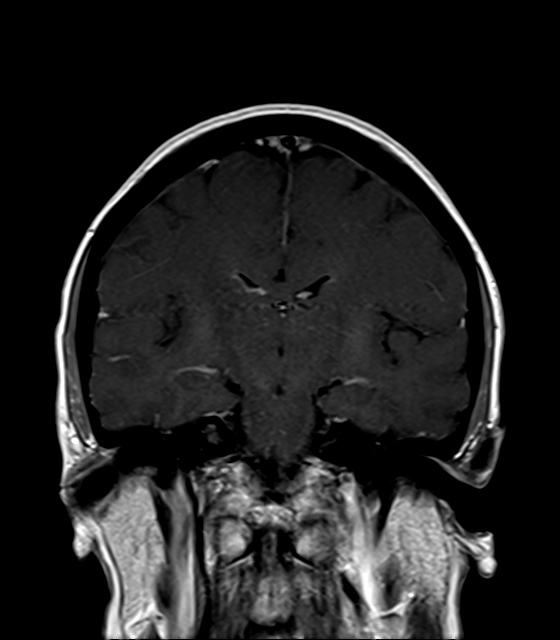
[im 26/26]
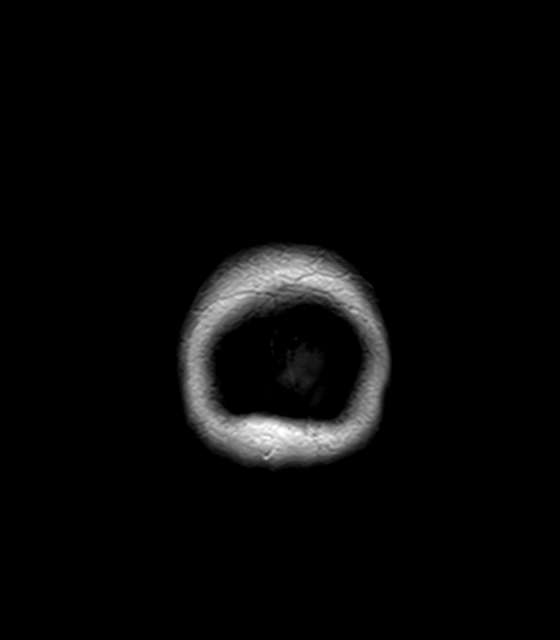

[38 of 48 positions shown; findings below may reference images not displayed]

FINDINGS: INTRACRANIAL CONTENTS: No reduced diffusion to suggest acute
ischemia. No susceptibility artifact to suggest hemorrhage. The
ventricles and sulci are normal for patient's age. No suspicious
parenchymal signal, masses, mass effect. No abnormal
intraparenchymal or extra-axial enhancement. No abnormal extra-axial
fluid collections. No extra-axial masses. Normal symmetric
appearance of [employer] [place] and olfactory nerves.

VASCULAR: Normal major intracranial vascular flow voids present at
skull base.

SKULL AND UPPER CERVICAL SPINE: No abnormal sellar expansion. No
suspicious calvarial bone marrow signal. Craniocervical junction
maintained.

SINUSES/ORBITS: Pan paranasal sinusitis with lobulated appearance,
complete opacification of the ethmoid, sphenoid and frontal sinuses.
Atretic LEFT maxillary sinus compatible with chronic sinusitis.
Status post FESS.The included ocular globes and orbital contents are
non-suspicious.

OTHER: None.
IMPRESSION: 1. Severe pan paranasal sinusitis, probable polyposis, status post
FESS.
2. Otherwise normal MRI brain with and without contrast.

## 2020-08-24 ENCOUNTER — Other Ambulatory Visit: Payer: Self-pay | Admitting: Allergy

## 2020-09-24 ENCOUNTER — Other Ambulatory Visit: Payer: Self-pay | Admitting: Allergy

## 2020-09-24 DIAGNOSIS — J3089 Other allergic rhinitis: Secondary | ICD-10-CM

## 2020-09-24 DIAGNOSIS — J4540 Moderate persistent asthma, uncomplicated: Secondary | ICD-10-CM

## 2020-09-25 MED ORDER — MONTELUKAST SODIUM 10 MG PO TABS: 10.0000 mg | ORAL_TABLET | Freq: Every day | ORAL | 0 refills | Status: DC

## 2020-09-25 NOTE — Telephone Encounter (Signed)
Courtesy refill has ben sent.

## 2020-09-25 NOTE — Telephone Encounter (Signed)
I'm ok with this refill request; please send in the refill.    I believe she lives part of the time in a different location.    She is typically well controlled  on singulair and I am ok with a televisit if she is not in this area at this time.

## 2020-09-25 NOTE — Telephone Encounter (Signed)
She was last seen 10/31/19 and was told to come back in 6 months 05/02/20 but never made an appointment. She also cancelled both appointments for march and April of 2022

## 2020-09-25 NOTE — Addendum Note (Signed)
Addended by: Robet Leu A on: 09/25/2020 12:25 PM   Modules accepted: Orders

## 2020-10-21 ENCOUNTER — Other Ambulatory Visit: Payer: Self-pay | Admitting: Allergy

## 2020-10-29 ENCOUNTER — Ambulatory Visit: Payer: BC Managed Care – PPO | Admitting: Allergy

## 2020-11-07 ENCOUNTER — Other Ambulatory Visit: Payer: Self-pay | Admitting: Allergy

## 2020-11-19 ENCOUNTER — Other Ambulatory Visit: Payer: Self-pay

## 2020-11-19 ENCOUNTER — Ambulatory Visit: Payer: BC Managed Care – PPO | Admitting: Allergy

## 2020-11-19 ENCOUNTER — Encounter: Payer: Self-pay | Admitting: Allergy

## 2020-11-19 VITALS — BP 120/76 | HR 75 | Temp 97.8°F | Resp 16 | Ht 64.0 in | Wt 153.2 lb

## 2020-11-19 DIAGNOSIS — J3089 Other allergic rhinitis: Secondary | ICD-10-CM | POA: Diagnosis not present

## 2020-11-19 DIAGNOSIS — J339 Nasal polyp, unspecified: Secondary | ICD-10-CM | POA: Diagnosis not present

## 2020-11-19 DIAGNOSIS — J4540 Moderate persistent asthma, uncomplicated: Secondary | ICD-10-CM | POA: Diagnosis not present

## 2020-11-19 MED ORDER — ALBUTEROL SULFATE HFA 108 (90 BASE) MCG/ACT IN AERS: INHALATION_SPRAY | RESPIRATORY_TRACT | 3 refills | Status: DC

## 2020-11-19 MED ORDER — XHANCE 93 MCG/ACT NA EXHU: 2.0000 | INHALANT_SUSPENSION | Freq: Two times a day (BID) | NASAL | 11 refills | Status: DC

## 2020-11-19 MED ORDER — MONTELUKAST SODIUM 10 MG PO TABS: 10.0000 mg | ORAL_TABLET | Freq: Every day | ORAL | 11 refills | Status: DC

## 2020-11-19 MED ORDER — BREO ELLIPTA 100-25 MCG/INH IN AEPB: INHALATION_SPRAY | RESPIRATORY_TRACT | 11 refills | Status: DC

## 2020-11-19 NOTE — Patient Instructions (Addendum)
Chronic (allergic) rhinosinusitis with nasal polyps - at this time sense of smell and taste remains good! - continue Montelukast 10 mg once a day as needed - she has done well with monthly use of Dupixent thus discussed it is reasonable to try injections every 6 weeks and see if she continues to maintain control.  She is agreeable to trying every 6 weeks.  Advised if she notes any worsening nasal symptoms or asthma symptoms then to go back to monthly injections and remain there.  We have previously discussed Xolair has approval for nasal polyps and if needed we could change to this option in the future if Dupixent becomes less effective.   - continue allergen avoidance measures  Asthma, mod persistent   - very well controlled at this time  - Continue Breo 1 puff once a day  - Continue Montekulast 10mg  at bedtime (as above)  - Have access to albuterol inhaler 2 puffs every 4-6 hours as needed for cough/wheeze/shortness of breath/chest tightness.  May use 15-20 minutes prior to activity.   Monitor frequency of use.     Asthma control goals:   Full participation in all desired activities (may need albuterol before activity)  Albuterol use two time  or less a week on average (not counting use with activity)  Cough interfering with sleep two time or less a month  Oral steroids no more than once a year  No hospitalizations   Follow-up in 12 months or sooner if needed

## 2020-11-19 NOTE — Progress Notes (Signed)
Follow-up Note  RE: Jessica Clark MRN: [id number] DOB: 1965/08/18 Date of Office Visit: 11/19/2020   History of present illness: Jessica Clark is a 55 y.o. female presenting today for follow-up of chronic rhinosinusitis with nasal polyps and asthma.  She was last seen in the office on 10/31/19 by myself.  She has not had any major health changes, surgeries or hospitalizations since her last visit.  She states her nasal polyp control still seems to be doing well.  She states she can still taste and smell.  At her last visit I did recommend that she decrease Dupixent to monthly which she has done over the past year.  She states she is doing the same as when she was on every 2 weeks.  She also continues to take montelukast daily.  She started the [facility] back up about 6 weeks ago due to pollen exposure an increase in nasal congestion but states that it really helps.  She would like a refill of this today. She also states that she has not needed to use albuterol in the past year with the use of the Singulair, Dupixent and Breo 1 puff once a day.  She denies any nighttime awakenings.  She has not had any ED or urgent care visits or any systemic steroid needs.  Review of systems: Review of Systems  Constitutional: Negative.   HENT: Negative.   Eyes: Negative.   Respiratory: Negative.   Cardiovascular: Negative.   Gastrointestinal: Negative.   Musculoskeletal: Negative.   Skin: Negative.   Neurological: Negative.     All other systems negative unless noted above in HPI  Past medical/social/surgical/family history have been reviewed and are unchanged unless specifically indicated below.  No changes  Medication List: Current Outpatient Medications  Medication Sig Dispense Refill  . DIVIGEL 1 MG/GM GEL     . dupilumab (DUPIXENT) 300 MG/2ML prefilled syringe INJECT 1 SYRINGE UNDER THE SKIN EVERY OTHER WEEK 2 mL 11  . progesterone (PROMETRIUM) 100 MG capsule Take 200 mg by mouth daily.      Marland Kitchen albuterol (PROAIR HFA) 108 (90 Base) MCG/ACT inhaler Use 2 puffs every four hours as needed for cough or wheeze.  May use 2 puffs 10-20 minutes prior to exercise. 18 g 3  . eculizumab (SOLIRIS) 300 MG/30ML SOLN injection Inject 300 mg into the vein every 8 (eight) weeks. (Patient not taking: Reported on 11/19/2020)    . fluticasone furoate-vilanterol (BREO ELLIPTA) 100-25 MCG/INH AEPB USE 1 INHALATION ORALLY    DAILY 60 each 11  . Fluticasone Propionate (XHANCE) 93 MCG/ACT EXHU Place 2 sprays into the nose 2 (two) times daily. 32 mL 11  . montelukast (SINGULAIR) 10 MG tablet Take 1 tablet (10 mg total) by mouth daily. 30 tablet 11   Current Facility-Administered Medications  Medication Dose Route Frequency Provider Last Rate Last Admin  . Mepolizumab SOLR 100 mL  100 mL Subcutaneous Q28 days Jessica Priest, MD   100 mL at 01/30/17 1002     Known medication allergies: Allergies  Allergen Reactions  . Penicillins Other (See Comments)    Blisters in mouth     Physical examination: Blood pressure 120/76, pulse 75, temperature 97.8 F (36.6 C), resp. rate 16, height 5\' 4"  (1.626 m), weight 153 lb 3.2 oz (69.5 kg), SpO2 98 %.  General: Alert, interactive, in no acute distress. HEENT: PERRLA, TMs pearly gray, turbinates minimally edematous without discharge, post-pharynx non erythematous. Neck: Supple without lymphadenopathy. Lungs: Clear to auscultation without wheezing,  rhonchi or rales. {no increased work of breathing. CV: Normal S1, S2 without murmurs. Abdomen: Nondistended, nontender. Skin: Warm and dry, without lesions or rashes. Extremities:  No clubbing, cyanosis or edema. Neuro:   Grossly intact.  Diagnositics/Labs: None today  Assessment and plan:   Chronic (allergic) rhinosinusitis with nasal polyps - at this time sense of smell and taste remains good! - continue Montelukast 10 mg once a day as needed - she has done well with monthly use of Dupixent thus discussed it  is reasonable to try injections every 6 weeks and see if she continues to maintain control.  She is agreeable to trying every 6 weeks.  Advised if she notes any worsening nasal symptoms or asthma symptoms then to go back to monthly injections and remain there.  We have previously discussed Xolair has approval for nasal polyps and if needed we could change to this option in the future if Dupixent becomes less effective.   - continue allergen avoidance measures  Asthma, mod persistent   - very well controlled at this time  - Continue Breo 1 puff once a day  - Continue Montekulast 10mg  at bedtime (as above)  - Have access to albuterol inhaler 2 puffs every 4-6 hours as needed for cough/wheeze/shortness of breath/chest tightness.  May use 15-20 minutes prior to activity.   Monitor frequency of use.     Asthma control goals:   Full participation in all desired activities (may need albuterol before activity)  Albuterol use two time  or less a week on average (not counting use with activity)  Cough interfering with sleep two time or less a month  Oral steroids no more than once a year  No hospitalizations   Follow-up in 12 months or sooner if needed  I appreciate the opportunity to take part in Jessica Clark's care. Please do not hesitate to contact me with questions.  Sincerely,   , MD Allergy/Immunology [facility]

## 2020-12-16 ENCOUNTER — Other Ambulatory Visit: Payer: Self-pay | Admitting: Allergy

## 2020-12-16 DIAGNOSIS — J4540 Moderate persistent asthma, uncomplicated: Secondary | ICD-10-CM

## 2020-12-16 DIAGNOSIS — J3089 Other allergic rhinitis: Secondary | ICD-10-CM

## 2020-12-21 ENCOUNTER — Other Ambulatory Visit: Payer: Self-pay | Admitting: Allergy

## 2020-12-21 DIAGNOSIS — J4540 Moderate persistent asthma, uncomplicated: Secondary | ICD-10-CM

## 2020-12-21 DIAGNOSIS — J3089 Other allergic rhinitis: Secondary | ICD-10-CM

## 2020-12-31 ENCOUNTER — Other Ambulatory Visit: Payer: Self-pay

## 2020-12-31 ENCOUNTER — Ambulatory Visit
Admission: RE | Admit: 2020-12-31 | Discharge: 2020-12-31 | Disposition: A | Discharge status: Home / Self Care | Payer: BC Managed Care – PPO | Source: Ambulatory Visit | Attending: Family Medicine | Admitting: Family Medicine

## 2020-12-31 ENCOUNTER — Ambulatory Visit: Payer: BC Managed Care – PPO | Admitting: Family Medicine

## 2020-12-31 ENCOUNTER — Encounter: Payer: Self-pay | Admitting: Family Medicine

## 2020-12-31 VITALS — BP 136/80 | HR 75 | Temp 98.2°F | Resp 16 | Ht 64.0 in | Wt 155.2 lb

## 2020-12-31 DIAGNOSIS — J339 Nasal polyp, unspecified: Secondary | ICD-10-CM | POA: Diagnosis not present

## 2020-12-31 DIAGNOSIS — J3089 Other allergic rhinitis: Secondary | ICD-10-CM

## 2020-12-31 DIAGNOSIS — R059 Cough, unspecified: Secondary | ICD-10-CM | POA: Diagnosis not present

## 2020-12-31 DIAGNOSIS — J4541 Moderate persistent asthma with (acute) exacerbation: Secondary | ICD-10-CM

## 2020-12-31 MED ORDER — PREDNISONE 10 MG PO TABS: ORAL_TABLET | ORAL | 0 refills | Status: DC

## 2020-12-31 NOTE — Progress Notes (Signed)
[address] [place] [address] Dept: [phone]  FOLLOW UP NOTE  Patient ID: Jessica Clark, female    DOB: [birth year]  Age: 55 y.o. MRN: [id number] Date of Office Visit: 12/31/2020  Assessment  Chief Complaint: Asthma (Wheezing and coughing - wednesday took albuterol 7 times ) and Other (Has covid 3 weeks ago )  HPI Jessica Clark is a 55 year old female who presents acute evaluation of cough and wheeze 3 weeks post COVID.  She was last seen in this clinic on 11/17/2020 by Dr. Delorse Lek for evaluation of rhinitis, and nasal polyposis.  At today's visit, she reports that about 3 weeks ago she tested positive for COVID infection.  She reports at that time that she had symptoms including cough, wheeze, mucus production, headache, and loss of smell and taste, and chest tightness.  She reports that on Saturday she began to experience worsening of symptoms including shortness of breath with rest and activity, chest tightness, wheeze occurring during the day, and cough producing green sputum.  She reports on Saturday that she used her albuterol inhaler 7 times with moderate relief of symptoms.  She reports that she would have gone to the emergency department for treatment had she not had company that day.  She continues montelukast 10 mg once a day, Breo 100-1 puff once a day and usually uses albuterol infrequently.  Chronic rhinitis is reported as moderately well controlled with clear rhinorrhea that has resolved over the last few days and a small amount of postnasal drainage. She denies headache or sinus pressure. She continues Dupixent injections once every 4 weeks for control of nasal polyposis.  She is not currently using nasal steroid spray or nasal saline rinses.  Her current medications are listed in the chart.   Drug Allergies:  Allergies  Allergen Reactions  . Penicillins Other (See Comments) and Rash    Blisters in mouth    Physical Exam: BP 136/80   Pulse 75   Temp 98.2  F (36.8 C)   Resp 16   Ht 5\' 4"  (1.626 m)   Wt 155 lb 3.2 oz (70.4 kg)   SpO2 96%   BMI 26.64 kg/m    Physical Exam Vitals reviewed.  Constitutional:      Appearance: Normal appearance.  HENT:     Head: Normocephalic and atraumatic.     Right Ear: Tympanic membrane normal.     Left Ear: Tympanic membrane normal.     Nose:     Comments: Bilateral nares slightly erythematous with clear nasal drainage noted.  Pharynx normal.  Ears normal.  Eyes normal.    Mouth/Throat:     Pharynx: Oropharynx is clear.  Eyes:     Conjunctiva/sclera: Conjunctivae normal.  Cardiovascular:     Rate and Rhythm: Normal rate and regular rhythm.     Heart sounds: Normal heart sounds. No murmur heard.   Pulmonary:     Effort: Pulmonary effort is normal.     Comments: Lungs with scattered rhonchi that mostly cleared with cough.  No rubs or wheezes noted Musculoskeletal:        General: Normal range of motion.     Cervical back: Normal range of motion and neck supple.  Skin:    General: Skin is warm and dry.  Neurological:     Mental Status: She is alert and oriented to person, place, and time.  Psychiatric:        Mood and Affect: Mood normal.  Behavior: Behavior normal.        Thought Content: Thought content normal.        Judgment: Judgment normal.     Diagnostics: FVC 2.88, FEV1 2.35.  Predicted FVC 3.45, predicted FEV1 2.70.  Spirometry indicates normal ventilatory function.  Postbronchodilator FVC 3.23, FEV1 2.56.  Postbronchodilator spirometry indicates 12% improvement in FVC and 9% improvement in FEV1.  Assessment and Plan: 1. Moderate persistent asthma with acute exacerbation   2. Perennial and seasonal allergic rhinitis   3. Cough   4. Nasal polyposis     Meds ordered this encounter  Medications  . predniSONE (DELTASONE) 10 MG tablet    Sig: Take 2 tablets twice a day for 3 days, then take 2 tablets once a day for 1 day, then take 1 tablet on the 5th day, then Stop.     Dispense:  15 tablet    Refill:  0    Patient Instructions   Cough post COVID We have placed an order for a chest xray. We will call you when the result becomes available  Asthma Continue montelukast 10 mg once a day to prevent cough or wheeze Continue Breo 100-1 once a day to prevent cough or wheeze Continue albuterol 2 puffs once every 4 hours as needed for cough or wheeze You may use albuterol 2 puffs 5 to 15 minutes before activity to decrease cough or wheeze  Allergic rhinitis Continue avoidance measures directed toward grass pollen, tree pollen, molds, dust mite, dog, cat, and cockroach as listed below Continue Xhance 2 sprays in each nostril twice a day OR Flonase 2 sprays in each nostril once a day as needed for nasal symptoms Consider saline nasal rinses as needed for nasal symptoms. Use this before any medicated nasal sprays for best result For thick post nasal drainage, begin Mucinex [phone] mg twice a day and increase fluid intake as tolerated.   Nasal polyposis Continue Xhance OR Flonase as listed above Continue Dupixent injections and have access to an epinephrine autoinjector set  Call the clinic if this treatment plan is not working well for you  Follow up in 1 month or sooner if needed.   Return in about 4 weeks (around 01/28/2021), or if symptoms worsen or fail to improve.    Thank you for the opportunity to care for this patient.  Please do not hesitate to contact me with questions.  Thermon Leyland, FNP [facility]

## 2020-12-31 NOTE — Patient Instructions (Addendum)
  Cough post COVID We have placed an order for a chest xray. We will call you when the result becomes available  Asthma Continue montelukast 10 mg once a day to prevent cough or wheeze Continue Breo 100-1 once a day to prevent cough or wheeze Continue albuterol 2 puffs once every 4 hours as needed for cough or wheeze You may use albuterol 2 puffs 5 to 15 minutes before activity to decrease cough or wheeze  Allergic rhinitis Continue avoidance measures directed toward grass pollen, tree pollen, molds, dust mite, dog, cat, and cockroach as listed below Continue Xhance 2 sprays in each nostril twice a day OR Flonase 2 sprays in each nostril once a day as needed for nasal symptoms Consider saline nasal rinses as needed for nasal symptoms. Use this before any medicated nasal sprays for best result For thick post nasal drainage, begin Mucinex [phone] mg twice a day and increase fluid intake as tolerated.   Nasal polyposis Continue Xhance OR Flonase as listed above Continue Dupixent injections and have access to an epinephrine autoinjector set  Call the clinic if this treatment plan is not working well for you  Follow up in 1 month or sooner if needed.

## 2020-12-31 NOTE — Progress Notes (Signed)
Can you please let this patient know that her cxr is clear. Please call in prednisone 10 mg tablets. Take 2 tablets twice a day for 3 days, then take 2 tablets once a day for 1 day, then take 1 tablet on the 5th day, then stop. Please have her continue Breo 1 puff once a day and use albuterol once every 4 hours as needed. Please have her call the clinic with any worsening symptoms or fever. Thank you

## 2021-02-24 ENCOUNTER — Ambulatory Visit: Payer: BC Managed Care – PPO | Admitting: Family Medicine

## 2021-02-26 ENCOUNTER — Other Ambulatory Visit: Payer: Self-pay | Admitting: Allergy

## 2021-02-26 DIAGNOSIS — J3089 Other allergic rhinitis: Secondary | ICD-10-CM

## 2021-02-26 DIAGNOSIS — J4540 Moderate persistent asthma, uncomplicated: Secondary | ICD-10-CM

## 2021-05-06 ENCOUNTER — Other Ambulatory Visit: Payer: Self-pay | Admitting: Allergy

## 2021-05-27 ENCOUNTER — Other Ambulatory Visit: Payer: Self-pay | Admitting: Allergy

## 2021-05-27 DIAGNOSIS — J4540 Moderate persistent asthma, uncomplicated: Secondary | ICD-10-CM

## 2021-05-27 DIAGNOSIS — J3089 Other allergic rhinitis: Secondary | ICD-10-CM

## 2021-06-28 ENCOUNTER — Ambulatory Visit: Payer: BC Managed Care – PPO | Admitting: Podiatry

## 2021-06-28 ENCOUNTER — Ambulatory Visit (INDEPENDENT_AMBULATORY_CARE_PROVIDER_SITE_OTHER): Payer: BC Managed Care – PPO

## 2021-06-28 ENCOUNTER — Other Ambulatory Visit: Payer: Self-pay

## 2021-06-28 DIAGNOSIS — M21611 Bunion of right foot: Secondary | ICD-10-CM | POA: Diagnosis not present

## 2021-06-28 DIAGNOSIS — M21612 Bunion of left foot: Secondary | ICD-10-CM

## 2021-06-28 DIAGNOSIS — M79672 Pain in left foot: Secondary | ICD-10-CM | POA: Diagnosis not present

## 2021-06-28 DIAGNOSIS — M21619 Bunion of unspecified foot: Secondary | ICD-10-CM | POA: Diagnosis not present

## 2021-06-28 NOTE — Patient Instructions (Signed)
Pre-Operative Instructions  Congratulations, you have decided to take an important step to improving your quality of life.  You can be assured that the doctors of [facility] will be with you every step of the way.  Plan to be at the surgery center/hospital at least 1 (one) hour prior to your scheduled time unless otherwise directed by the surgical center/hospital staff.  You must have a responsible adult accompany you, remain during the surgery and drive you home.  Make sure you have directions to the surgical center/hospital and know how to get there on time. For hospital based surgery you will need to obtain a history and physical form from your family physician within 1 month prior to the date of surgery- we will give you a form for you primary physician.  We make every effort to accommodate the date you request for surgery.  There are however, times where surgery dates or times have to be moved.  We will contact you as soon as possible if a change in schedule is required.   No Aspirin/Ibuprofen for one week before surgery.  If you are on aspirin, any non-steroidal anti-inflammatory medications (Mobic, Aleve, Ibuprofen) you should stop taking it 7 days prior to your surgery.  You make take Tylenol  For pain prior to surgery.  Medications- If you are taking daily heart and blood pressure medications, seizure, reflux, allergy, asthma, anxiety, pain or diabetes medications, make sure the surgery center/hospital is aware before the day of surgery so they may notify you which medications to take or avoid the day of surgery. No food or drink after midnight the night before surgery unless directed otherwise by surgical center/hospital staff. No alcoholic beverages 24 hours prior to surgery.  No smoking 24 hours prior to or 24 hours after surgery. Wear loose pants or shorts- loose enough to fit over bandages, boots, and casts. No slip on shoes, sneakers are best. Bring your boot with you to the  surgery center/hospital.  Also bring crutches or a walker if your physician has prescribed it for you.  If you do not have this equipment, it will be provided for you after surgery. If you have not been contracted by the surgery center/hospital by the day before your surgery, call to confirm the date and time of your surgery. Leave-time from work may vary depending on the type of surgery you have.  Appropriate arrangements should be made prior to surgery with your employer. Prescriptions will be provided immediately following surgery by your doctor.  Have these filled as soon as possible after surgery and take the medication as directed. Remove nail polish on the operative foot. Wash the night before surgery.  The night before surgery wash the foot and leg well with the antibacterial soap provided and water paying special attention to beneath the toenails and in between the toes.  Rinse thoroughly with water and dry well with a towel.  Perform this wash unless told not to do so by your physician.  Enclosed: 1 Ice pack (please put in freezer the night before surgery)   1 Hibiclens skin cleaner   Pre-op Instructions  If you have any questions regarding the instructions, do not hesitate to call our office at any point during this process.   [place]: [address] [place], [place] [address] [phone]  [place]: [address] [place], [place] [address] [phone]  Dr. Ovid Curd, DPM   Walking Boot, Adult A walking boot holds your foot or ankle in place after  an injury or a medical procedure. This helps with healing and prevents further injury. It has a hard, rigid outer frame that limits movement and supports your foot and leg. The inner lining is a layer of padded material. Walking boots also have adjustable straps to secure them over the foot and leg. A walking boot may be prescribed if you can put weight (bear weight) on your injured foot. How much you can walk while wearing the  boot will depend on the type and severity of your injury. How to put on a walking boot There are different types of walking boots. Each type has specific instructions about how to wear it properly. Follow instructions from your health care provider, such as: Ask someone to help you put on the boot, if needed. Sit to put on your boot. Doing this is more comfortable and helps to prevent falls. Open up the boot fully. Place your foot into the boot so your heel rests against the back. Your toes should be supported by the base of the boot. They should not hang over the front edge. Adjust the straps so the boot fits securely but is not too tight. Do not bend the hard frame of the boot to get a good fit. How to walk with a walking boot How much you can walk will depend on your injury. Some tips for managing with a boot include: Do not try to walk without wearing the boot unless your health care provider approves. Use other assistive walking devices, including crutches or canes, as told by your health care provider. On your uninjured foot, wear a shoe with a heel that is close to the height of the walking boot. Be careful when walking on surfaces that are uneven or wet. How to reduce swelling while using a walking boot  Rest your injured foot or leg as much as possible. If directed, put ice on the injured area. To do this: Put ice in a plastic bag. Place a towel between your skin and the bag. Leave the ice on for 20 minutes, 2-3 times a day. Remove the ice if your skin turns bright red. This is very important. If you cannot feel pain, heat, or cold, you have a greater risk of damage to the area. Keep your injured foot or leg raised (elevated) above the level of your heart for at least 2?3 hours each day or as told by your health care provider. If swelling gets worse, loosen the boot. Rest and elevate your foot and leg. How to care for your skin and foot while using a walking boot Wear a long sock  to protect your foot and leg from rubbing inside the boot. Take off the boot one time each day to check the injured area. Check your foot, the surrounding skin, and your leg to make sure there are no sores, rashes, swelling, or wounds. The skin should be a healthy color, not pale or blue. Try to notice if your walking pattern (gait) in the boot is fairly normal and that you are walking without a noticeable limp. Follow instructions from your health care provider about taking care of your incision or wound, if this applies. Clean and wash the injured area as told by your health care provider. Gently dry your foot and leg before putting the boot back on. Removing your walking boot Follow directions from your health care provider for removing the walking boot. Generally, it is okay to remove your walking boot: When you are  resting or sleeping. To clean your foot and leg. How to keep the walking boot clean Do not put any part of the boot in a washing machine or dryer. Do not use chemical cleaning products. These could irritate your skin, especially if you have a wound or an incision. Do not soak the liner of the boot. Use a washcloth with mild soap and water to clean the frame and the liner of the boot by hand. Allow the boot to air-dry completely before you put it back on your foot. Follow these instructions at home: Activity Your activity will be restricted depending on the type and severity of your injury. Follow instructions from your health care provider. Also: Bathe and shower as told by your health care provider. Do not do any activities that could make your injury worse. Do not drive if your affected foot is the one that you use for driving. Contact a health care provider if: The boot is cracked or damaged. The boot does not fit properly. Your foot or leg hurts. You have a rash, sore, or open sore (ulcer) on your foot or leg. The skin on your foot or leg is pale. You have a wound or  incision on the foot and it is getting worse. Your skin becomes painful, red, or irritated. Your swelling does not get better or it gets worse. Get help right away if: You have numbness in your foot or leg. The skin on your foot or leg is cold, blue, or gray. Summary A walking boot holds your foot or ankle in place after an injury or a medical procedure. There are different types of walking boots. Follow instructions about how to correctly wear your boot. Ask someone to help you put on the boot, if needed. It is important to check your skin and foot every day. Call your health care provider if you notice a rash or sore on your foot or leg. This information is not intended to replace advice given to you by your health care provider. Make sure you discuss any questions you have with your health care provider. Document Revised: 05/18/2020 Document Reviewed: 05/18/2020 Elsevier Patient Education  2022 [employer].

## 2021-07-04 DIAGNOSIS — M21619 Bunion of unspecified foot: Secondary | ICD-10-CM | POA: Insufficient documentation

## 2021-07-04 NOTE — Progress Notes (Signed)
Subjective:   Patient ID: Jessica Clark, female   DOB: 55 y.o.   MRN: [id number]   HPI 55 year old female presents the office today for concerns of a painful bunion with the left side worse than the right.  This been ongoing for a long time but has been worse over the last 3 months.  She is tried shoe modifications and offloading any significant improvement.  She is also starting to get some pain to the ball of her foot as well diffusely.  No recent injury or trauma.  No other concerns today.   Review of Systems  All other systems reviewed and are negative.  Past Medical History:  Diagnosis Date   Asthma    Eczema    Nasal polyp    Psoriasis    Psoriasis    Rheumatoid arthritis (HCC)    Urticaria     Past Surgical History:  Procedure Laterality Date   ADENOIDECTOMY     BLADDER REPAIR     nasal polyps  1987   SINOSCOPY       Current Outpatient Medications:    albuterol (PROAIR HFA) 108 (90 Base) MCG/ACT inhaler, Use 2 puffs every four hours as needed for cough or wheeze.  May use 2 puffs 10-20 minutes prior to exercise., Disp: 18 g, Rfl: 3   BREO ELLIPTA 100-25 MCG/INH AEPB, USE 1 INHALATION ORALLY    DAILY, Disp: 28 each, Rfl: 5   DIVIGEL 1 MG/GM GEL, , Disp: , Rfl:    dupilumab (DUPIXENT) 300 MG/2ML prefilled syringe, INJECT 1 SYRINGE UNDER THE SKIN EVERY OTHER WEEK, Disp: 2 mL, Rfl: 11   eculizumab (SOLIRIS) 300 MG/30ML SOLN injection, Inject 300 mg into the vein every 8 (eight) weeks. (Patient not taking: No sig reported), Disp: , Rfl:    Fluticasone Propionate (XHANCE) 93 MCG/ACT EXHU, Place 2 sprays into the nose 2 (two) times daily. (Patient not taking: Reported on 12/31/2020), Disp: 32 mL, Rfl: 11   montelukast (SINGULAIR) 10 MG tablet, Take 1 tablet (10 mg total) by mouth at bedtime., Disp: 90 tablet, Rfl: 0   predniSONE (DELTASONE) 10 MG tablet, Take 2 tablets twice a day for 3 days, then take 2 tablets once a day for 1 day, then take 1 tablet on the 5th day, then  Stop., Disp: 15 tablet, Rfl: 0   progesterone (PROMETRIUM) 100 MG capsule, Take 200 mg by mouth daily. , Disp: , Rfl:   Current Facility-Administered Medications:    Mepolizumab SOLR 100 mL, 100 mL, Subcutaneous, Q28 days, Kozlow, Alvira Philips, MD, 100 mL at 01/30/17 1002  Allergies  Allergen Reactions   Penicillins Other (See Comments) and Rash    Blisters in mouth          Objective:  Physical Exam  General: AAO x3, NAD  Dermatological: Mild erythema of the bunion site there is no significant warmth or any signs of infection.  No open lesions.  Vascular: Dorsalis Pedis artery and Posterior Tibial artery pedal pulses are 2/4 bilateral with immedate capillary fill time. There is no pain with calf compression, swelling, warmth, erythema.   Neruologic: Grossly intact via light touch bilateral.   Musculoskeletal: Moderate pain is present in the left side worse than right.  There is slight crepitation with range of motion.  No restriction of the range of motion of the joint.  Tenderness palpation on the first MPJ on the medial eminence.  Muscular strength 5/5 in all groups tested bilateral.  Gait: Unassisted, Nonantalgic.  Assessment:   Left foot symptomatic bunion     Plan:  -Treatment options discussed including all alternatives, risks, and complications -Etiology of symptoms were discussed -X-rays were obtained and reviewed with the patient.  Arthritic changes are noted there is moderate bunion present. -We discussed conservative as well as surgical treatment options.  She is tried shoe modifications and offloading significant provement she elects proceed with surgical invention.  We discussed treatment options for the bunion.  Discussed with her possible [place] osteotomy however given history of arthritis consider possible fusion.  We will make this determination intraoperatively.  Discussed both surgeries as well as postoperative course and she agrees and wished to  proceed. -The incision placement as well as the postoperative course was discussed with the patient. I discussed risks of the surgery which include, but not limited to, infection, bleeding, pain, swelling, need for further surgery, delayed or nonhealing, painful or ugly scar, numbness or sensation changes, over/under correction, recurrence, transfer lesions, further deformity, hardware failure, DVT/PE, loss of toe/foot. Patient understands these risks and wishes to proceed with surgery. The surgical consent was reviewed with the patient all 3 pages were signed. No promises or guarantees were given to the outcome of the procedure. All questions were answered to the best of my ability. Before the surgery the patient was encouraged to call the office if there is any further questions. The surgery will be performed at the [facility] on an outpatient basis. -Rheumatology clearance to see if any medications need to be held peri-op.   Vivi Barrack DPM

## 2021-07-12 ENCOUNTER — Encounter: Payer: Self-pay | Admitting: Podiatry

## 2021-07-27 ENCOUNTER — Telehealth: Payer: Self-pay | Admitting: Urology

## 2021-07-27 NOTE — Telephone Encounter (Signed)
DOS - 08/18/21  [place] BUNIONECTOMY LEFT --- [id number] VS [place] MPJ FUSION LEFT --- [id number]   BCBS EFFECTIVE DATE - 08/08/20   NO PRIOR AUTH REQUIRED FOR STATE BCBS

## 2021-08-18 ENCOUNTER — Other Ambulatory Visit: Payer: Self-pay | Admitting: Podiatry

## 2021-08-18 DIAGNOSIS — M2012 Hallux valgus (acquired), left foot: Secondary | ICD-10-CM

## 2021-08-18 MED ORDER — CLINDAMYCIN HCL 300 MG PO CAPS: 300.0000 mg | ORAL_CAPSULE | Freq: Three times a day (TID) | ORAL | 0 refills | Status: DC

## 2021-08-18 MED ORDER — PROMETHAZINE HCL 25 MG PO TABS: 25.0000 mg | ORAL_TABLET | Freq: Three times a day (TID) | ORAL | 0 refills | Status: AC | PRN

## 2021-08-18 MED ORDER — OXYCODONE-ACETAMINOPHEN 5-325 MG PO TABS: 1.0000 | ORAL_TABLET | Freq: Four times a day (QID) | ORAL | 0 refills | Status: DC | PRN

## 2021-08-18 NOTE — Progress Notes (Unsigned)
Postop medications sent 

## 2021-08-23 ENCOUNTER — Other Ambulatory Visit: Payer: Self-pay

## 2021-08-23 ENCOUNTER — Ambulatory Visit (INDEPENDENT_AMBULATORY_CARE_PROVIDER_SITE_OTHER): Payer: BC Managed Care – PPO | Admitting: Podiatry

## 2021-08-23 ENCOUNTER — Ambulatory Visit (INDEPENDENT_AMBULATORY_CARE_PROVIDER_SITE_OTHER): Payer: BC Managed Care – PPO

## 2021-08-23 VITALS — Temp 97.6°F

## 2021-08-23 DIAGNOSIS — M21619 Bunion of unspecified foot: Secondary | ICD-10-CM

## 2021-08-23 DIAGNOSIS — Z9889 Other specified postprocedural states: Secondary | ICD-10-CM

## 2021-08-23 MED ORDER — SULFAMETHOXAZOLE-TRIMETHOPRIM 800-160 MG PO TABS: 1.0000 | ORAL_TABLET | Freq: Two times a day (BID) | ORAL | 0 refills | Status: AC

## 2021-08-25 NOTE — Progress Notes (Signed)
Subjective: Jessica Clark is a 56 y.o. is seen today in office s/p left foot first MPJ arthrodesis preformed on 08/18/2021.  Overall states that she has been doing well.  She said that she had some lightheadedness in the first 48 hours but she did NOT pass out.  This has resolved.  Denies any chest pain or shortness of breath.  She is been nonweightbearing.  She has no other concerns.  Objective: General: No acute distress, AAOx3  DP/PT pulses palpable 2/4, CRT < 3 sec to all digits.  Protective sensation intact. Motor function intact.  Left foot: Incision is well coapted without any evidence of dehiscence.  Distal portion incision there is some localized erythema noted but there is no ascending cellulitis.  There was a small amount of bloody drainage present but there is no purulence.  There is no fluctuance or crepitation.  There is no malodor.  There was some edema present on the bunion area and clinically appeared that there was still a bunion but after surgery the bone was straight I think this is the swelling that is present looking at there is a bunion.  Arthrodesis that is stable. No other open lesions or pre-ulcerative lesions.  No pain with calf compression, swelling, warmth, erythema.   Assessment and Plan:  Status post left first MPJ arthrodesis, doing well with no complications   -Treatment options discussed including all alternatives, risks, and complications -X-rays obtained reviewed.  Hardware intact any complicating factors.  No evidence of acute fracture. -Given localized erythema of the distal aspect incision.  Prescribed Bactrim.  She did finish the short course of clindamycin I prescribed prophylactically postop.  I think that erythema is more from inflammation given surgery but in case of infection will start antibiotic.  I cleaned the incision today and a small amount of antibiotic ointment and a bandage was applied.  She can keep the dressing clean, dry, intact. -Remain  nonweightbearing -Continue to ice and elevate  No follow-ups on file.  Vivi Barrack DPM

## 2021-08-26 ENCOUNTER — Encounter: Payer: Self-pay | Admitting: Podiatry

## 2021-08-26 ENCOUNTER — Telehealth: Payer: Self-pay | Admitting: *Deleted

## 2021-08-26 NOTE — Telephone Encounter (Signed)
Patient is calling with concerns that the left heel is painful to touch,has gotten worse since last office visit. It is red, outside and soft on inside, has been applying Vaseline on the area and has 4 days left on antibiotic.Please advise.

## 2021-08-27 NOTE — Telephone Encounter (Signed)
I called the patient on 08/26/2021 to go over this. She has removed the bandage from the heel. I recommended to add padding to the boot. She has an extra piece from the boot she is going to try. If not helping she is going to call us 08/27/2021 to let us know and we can have her come in to either change out the boot or add some additional padding.

## 2021-09-02 ENCOUNTER — Other Ambulatory Visit: Payer: Self-pay

## 2021-09-02 ENCOUNTER — Ambulatory Visit (INDEPENDENT_AMBULATORY_CARE_PROVIDER_SITE_OTHER): Payer: BC Managed Care – PPO | Admitting: Podiatry

## 2021-09-02 DIAGNOSIS — Z9889 Other specified postprocedural states: Secondary | ICD-10-CM

## 2021-09-02 DIAGNOSIS — M21619 Bunion of unspecified foot: Secondary | ICD-10-CM

## 2021-09-05 NOTE — Progress Notes (Signed)
Subjective: Jessica Clark is a 56 y.o. is seen today in office s/p left foot first MPJ arthrodesis preformed on 08/18/2021.  She states she is doing better.  The swelling has improved.  She says it feels better than it did last week.  She denies any fevers or chills.  No nausea or vomiting.  No chest pain or shortness of breath.  No other concerns today.   Objective: General: No acute distress, AAOx3 -presents wearing cam boot DP/PT pulses palpable 2/4, CRT < 3 sec to all digits.  Protective sensation intact. Motor function intact.  Left foot: Incision is well coapted without any evidence of dehiscence.  There is decreased edema present.  There is no surrounding erythema, ascending cellulitis.  No drainage possibly obvious signs of infection.  Arthrodesis site appears to be stable.  No significant tenderness palpation on exam today. No other open lesions or pre-ulcerative lesions.  No pain with calf compression, swelling, warmth, erythema.   Assessment and Plan:  Status post left first MPJ arthrodesis, doing well with no complications   -Treatment options discussed including all alternatives, risks, and complications -Overall the swelling has improved.  Pain is also improving.  I discussed with her continue to ice and elevate.  Discussed she can wash the foot with soap and water daily and dry thoroughly and apply a small amount of antibiotic ointment and a bandage.  Monitor for any signs or symptoms of action. -Continue nonweightbearing for now while at work.  She did return to work today.  Discussed with her should be partial weightbearing at home.  Return in about 2 weeks (around 09/16/2021).  Repeat x-rays next appointment  Vivi Barrack DPM

## 2021-09-16 ENCOUNTER — Ambulatory Visit (INDEPENDENT_AMBULATORY_CARE_PROVIDER_SITE_OTHER): Payer: BC Managed Care – PPO | Admitting: Podiatry

## 2021-09-16 ENCOUNTER — Ambulatory Visit: Payer: BC Managed Care – PPO

## 2021-09-16 ENCOUNTER — Other Ambulatory Visit: Payer: Self-pay

## 2021-09-16 DIAGNOSIS — Z9889 Other specified postprocedural states: Secondary | ICD-10-CM

## 2021-09-16 DIAGNOSIS — M21619 Bunion of unspecified foot: Secondary | ICD-10-CM

## 2021-09-19 ENCOUNTER — Other Ambulatory Visit: Payer: Self-pay | Admitting: Allergy

## 2021-09-19 DIAGNOSIS — J3089 Other allergic rhinitis: Secondary | ICD-10-CM

## 2021-09-19 DIAGNOSIS — J4540 Moderate persistent asthma, uncomplicated: Secondary | ICD-10-CM

## 2021-09-20 NOTE — Progress Notes (Signed)
Subjective: Jessica Clark is a 56 y.o. is seen today in office s/p left foot first MPJ arthrodesis preformed on 08/18/2021.  She states she is doing better.  While at work she uses a knee scooter but at home she is using the cam boot and crutches and partial weightbearing.  No recent injury or changes otherwise and she denies any fevers or chills.  No chest pain or shortness of breath.   Objective: General: No acute distress, AAOx3 -presents wearing cam boot DP/PT pulses palpable 2/4, CRT < 3 sec to all digits.  Protective sensation intact. Motor function intact.  Left foot: Incision is well coapted without any evidence of dehiscence.  There is decreased edema present there is no erythema or warmth or any signs of infection.  Arthrodesis site appears to be stable and the toes in rectus position. No other open lesions or pre-ulcerative lesions.  No pain with calf compression, swelling, warmth, erythema.   Assessment and Plan:  Status post left first MPJ arthrodesis, improving  -Treatment options discussed including all alternatives, risks, and complications -X-rays obtained and reviewed today (although I don't see them in Epic)-increase consolidation across the arthrodesis site with hardware intact any complicating factors. -At this point discussed with her starting transition to partial weightbearing in the cam boot.  She can get the knee scooter at work to use if needed but otherwise she can start the apply a somewhat more pressure.  If there is any increasing pain or swelling to remain nonweightbearing.  Continue ice and elevate as well as compression wrapping postoperative edema.  Return in about 2 weeks (around 09/30/2021) for post-op check, x-ray.  Vivi Barrack DPM

## 2021-09-30 ENCOUNTER — Ambulatory Visit (INDEPENDENT_AMBULATORY_CARE_PROVIDER_SITE_OTHER): Payer: BC Managed Care – PPO | Admitting: Podiatry

## 2021-09-30 ENCOUNTER — Other Ambulatory Visit: Payer: Self-pay

## 2021-09-30 ENCOUNTER — Ambulatory Visit (INDEPENDENT_AMBULATORY_CARE_PROVIDER_SITE_OTHER): Payer: BC Managed Care – PPO

## 2021-09-30 DIAGNOSIS — Z9889 Other specified postprocedural states: Secondary | ICD-10-CM | POA: Diagnosis not present

## 2021-09-30 DIAGNOSIS — M21612 Bunion of left foot: Secondary | ICD-10-CM

## 2021-10-03 NOTE — Progress Notes (Signed)
Subjective: Jessica Clark is a 56 y.o. is seen today in office s/p left foot first MPJ arthrodesis preformed on 08/18/2021.  She does feel she is making good progress since I last saw her.  She presents today walking in a cam boot.  She uses a knee scooter some while at work if she is having to walk a long distance but otherwise she is slow to walk more in the cam boot Feeling well.  No fevers or chills.  No other concerns.    Objective: General: No acute distress, AAOx3 -presents wearing cam boot DP/PT pulses palpable 2/4, CRT < 3 sec to all digits.  Protective sensation intact. Motor function intact.  Left foot: Incision is well coapted without any evidence of dehiscence.  There is decreased edema.  There is no erythema or warmth.  Arthrodesis site is stable. No other open lesions or pre-ulcerative lesions.  No pain with calf compression, swelling, warmth, erythema.   Assessment and Plan:  Status post left first MPJ arthrodesis, improving  -Treatment options discussed including all alternatives, risks, and complications -X-rays obtained and reviewed today which does reveal hardware intact and complicating factors and increased consolidation noted. -Discussed that she can weight-bear as tolerated in cam boot. -Discussed she can evaluate started transition to regular shoe as tolerated.  She still some swelling present on the left foot although mild and so I would like to get her orthotics we will plan on doing this next appointment.   -Continue to ice and elevate as well.  No follow-ups on file.  Vivi Barrack DPM

## 2021-10-14 ENCOUNTER — Other Ambulatory Visit: Payer: Self-pay

## 2021-10-14 ENCOUNTER — Encounter: Payer: BC Managed Care – PPO | Admitting: Podiatry

## 2021-10-14 ENCOUNTER — Other Ambulatory Visit: Payer: Self-pay | Admitting: Allergy

## 2021-10-14 ENCOUNTER — Ambulatory Visit (INDEPENDENT_AMBULATORY_CARE_PROVIDER_SITE_OTHER): Payer: BC Managed Care – PPO

## 2021-10-14 ENCOUNTER — Other Ambulatory Visit: Payer: BC Managed Care – PPO

## 2021-10-14 ENCOUNTER — Ambulatory Visit (INDEPENDENT_AMBULATORY_CARE_PROVIDER_SITE_OTHER): Payer: BC Managed Care – PPO | Admitting: Podiatry

## 2021-10-14 DIAGNOSIS — Z9889 Other specified postprocedural states: Secondary | ICD-10-CM

## 2021-10-14 DIAGNOSIS — J3089 Other allergic rhinitis: Secondary | ICD-10-CM

## 2021-10-14 DIAGNOSIS — J4540 Moderate persistent asthma, uncomplicated: Secondary | ICD-10-CM

## 2021-10-14 DIAGNOSIS — M21612 Bunion of left foot: Secondary | ICD-10-CM

## 2021-10-15 ENCOUNTER — Encounter: Payer: BC Managed Care – PPO | Admitting: Podiatry

## 2021-10-17 NOTE — Progress Notes (Signed)
Subjective: ?Jessica Clark is a 56 y.o. is seen today in office s/p left foot first MPJ arthrodesis preformed on 08/18/2021.  She states that she is continue to improve and she is not having any pain.  She is been walking the cam boot but she has gone to regular shoe at home for short distances.  She is having no pain with this.  Occasional swelling which fluctuates.  No injuries.  No fevers or chills.  No other concerns.  ? ?Objective: ?General: No acute distress, AAOx3 -presents wearing cam boot ?DP/PT pulses palpable 2/4, CRT < 3 sec to all digits.  ?Protective sensation intact. Motor function intact.  ?Left foot: Incision is well coapted without any evidence of dehiscence.  Scar is well formed.  There is no tenderness palpation along the surgical site.  There is decreased edema.  There is no erythema or warmth.  Arthrodesis site is stable. ?No other open lesions or pre-ulcerative lesions.  ?No pain with calf compression, swelling, warmth, erythema.  ? ?Assessment and Plan:  ?Status post left first MPJ arthrodesis, improving ? ?-Treatment options discussed including all alternatives, risks, and complications ?-X-rays obtained and reviewed today which does reveal hardware intact and complicating factors and increased consolidation noted. ?-At this point discussed with her she can start to transition to regular shoe full-time as she is not having any pain.  Continue ice and elevate as well as compression to help with any residual postoperative edema. ? ?Return in about 4 weeks (around 11/11/2021). ? ?Vivi Barrack DPM ? ?

## 2021-10-25 ENCOUNTER — Telehealth: Payer: Self-pay | Admitting: Podiatry

## 2021-10-25 NOTE — Telephone Encounter (Signed)
Pt states that she is 9wks post surgery and she is now experiencing sharp stabbing pain in the great toe when she is walking. She has a post ov coming up on 11/11/2021. Wants to know if this is normal to the healing process or not. ? ?Please advise. ?

## 2021-10-28 ENCOUNTER — Ambulatory Visit (INDEPENDENT_AMBULATORY_CARE_PROVIDER_SITE_OTHER): Payer: BC Managed Care – PPO

## 2021-10-28 ENCOUNTER — Other Ambulatory Visit: Payer: Self-pay

## 2021-10-28 ENCOUNTER — Ambulatory Visit (INDEPENDENT_AMBULATORY_CARE_PROVIDER_SITE_OTHER): Payer: BC Managed Care – PPO | Admitting: Podiatry

## 2021-10-28 DIAGNOSIS — M84378A Stress fracture, left toe(s), initial encounter for fracture: Secondary | ICD-10-CM

## 2021-10-28 DIAGNOSIS — M21612 Bunion of left foot: Secondary | ICD-10-CM

## 2021-10-28 DIAGNOSIS — Z9889 Other specified postprocedural states: Secondary | ICD-10-CM

## 2021-10-30 NOTE — Progress Notes (Signed)
Subjective: ?Jessica Clark is a 56 y.o. is seen today in office s/p left foot first MPJ arthrodesis preformed on 08/18/2021.  She states the surgical site is fine but she is having pain to the fourth toe.  She is back to one of her shoes when she does a lot of walking up to 5 miles a day at work.  No injuries that she reports.  No fevers or chills.  Swelling has been improving.  No other concerns.  ? ?Objective: ?General: No acute distress, AAOx3 -presents wearing cam boot ?DP/PT pulses palpable 2/4, CRT < 3 sec to all digits.  ?Protective sensation intact. Motor function intact.  ?Left foot: Incision is well coapted without any evidence of dehiscence.  Scar is well formed.  There is no tenderness palpation of the first MPJ arthrodesis site.  The majority tenderness is localized on the fourth toe and along the fourth MPJ.  There is no tenderness directly on the metatarsals or other areas of the foot.  No significant edema.  Mild bruised appearance of the fourth digit. ?No open lesions. ?No pain with calf compression, swelling, warmth, erythema.  ? ?Assessment and Plan:  ?Status post left first MPJ arthrodesis, fourth toe pain ? ?-Treatment options discussed including all alternatives, risks, and complications ?-Etiology of symptoms were discussed-I think her symptoms are coming from compensation incisional injury. ?-X-rays obtained and reviewed today which does reveal hardware intact and complicating factors and increased consolidation noted.  Concern for possible stress reaction of the proximal phalanx of the fourth digit. ?-Discussed with her trying to buddy splint the toe to see if that will be helpful for stability.  I also dispensed a graphite insert that she can wear underneath her inserts out of her regular shoe for immobilization. ?-Continue to ice and elevate.  Continue with supportive shoe gear.  If needed can go back into the boot or can dispense a surgical shoe. ? ?Trula Slade DPM ?

## 2021-11-01 ENCOUNTER — Telehealth: Payer: Self-pay | Admitting: *Deleted

## 2021-11-01 NOTE — Telephone Encounter (Signed)
L/m for patient if still on dupixent needs MD appt for reapproval and should contact clinic for same ?

## 2021-11-06 ENCOUNTER — Other Ambulatory Visit: Payer: Self-pay | Admitting: Family Medicine

## 2021-11-11 ENCOUNTER — Ambulatory Visit (INDEPENDENT_AMBULATORY_CARE_PROVIDER_SITE_OTHER): Payer: BC Managed Care – PPO

## 2021-11-11 ENCOUNTER — Ambulatory Visit (INDEPENDENT_AMBULATORY_CARE_PROVIDER_SITE_OTHER): Payer: BC Managed Care – PPO | Admitting: Podiatry

## 2021-11-11 DIAGNOSIS — M21612 Bunion of left foot: Secondary | ICD-10-CM

## 2021-11-11 DIAGNOSIS — Z9889 Other specified postprocedural states: Secondary | ICD-10-CM

## 2021-11-13 NOTE — Progress Notes (Signed)
Subjective: ?Jessica Clark is a 56 y.o. is seen today in office s/p left foot first MPJ arthrodesis preformed on 08/18/2021.  She states that she is doing much better than last appointment.  She still been buddy splinting the toe because it makes it feel better but she is not using a graphite insert as it was too stiff.  She uses a knee scooter when she is walking long distances at school otherwise she is wearing a regular shoe walking.  No new injuries or concerns since I last saw her.    ? ?Objective: ?General: No acute distress, AAOx3 -presents wearing cam boot ?DP/PT pulses palpable 2/4, CRT < 3 sec to all digits.  ?Protective sensation intact. Motor function intact.  ?Left foot: Incision is well coapted without any evidence of dehiscence.  Scar is well formed.  There is no tenderness palpation along the surgical site.  Arthrodesis site is stable.  Toes are in rectus position.  There is no significant pain on the lesser digits particularly there is no pain in the fourth toe today.  No significant edema. ?No open lesions. ?No pain with calf compression, swelling, warmth, erythema.  ? ?Assessment and Plan:  ?Status post left first MPJ arthrodesis, fourth toe pain ? ?-Treatment options discussed including all alternatives, risks, and complications ?-X-rays were obtained and reviewed.  3 views of the left foot were obtained.  Hardware intact along the first metatarsal phalangeal joint with with increased consolidation.  No evidence of hardware failure.  There is no evidence of acute fracture noted today. ?-At this point continue with supportive shoe gear and gradual increase activity level as tolerated.  Continue to ice, elevate as well as compression palpated postoperative edema. ? ?Return in about 6 weeks (around 12/23/2021).  X-ray next appointment ? ?Trula Slade DPM ?

## 2021-11-18 ENCOUNTER — Ambulatory Visit: Payer: BC Managed Care – PPO | Admitting: Allergy

## 2021-11-22 ENCOUNTER — Telehealth: Payer: Self-pay | Admitting: Podiatry

## 2021-11-22 ENCOUNTER — Other Ambulatory Visit: Payer: Self-pay | Admitting: Podiatry

## 2021-11-22 DIAGNOSIS — M21612 Bunion of left foot: Secondary | ICD-10-CM

## 2021-11-22 NOTE — Telephone Encounter (Signed)
Patient said she is still hobbling around and it was suggested that she try some physical therapy. She wanted to inquire about it.  ?

## 2021-11-24 ENCOUNTER — Telehealth: Payer: Self-pay | Admitting: *Deleted

## 2021-11-24 NOTE — Telephone Encounter (Signed)
completed

## 2021-11-24 NOTE — Telephone Encounter (Signed)
Patient is calling for status of PT referral to [facility] in [place]. Please advise. ?

## 2021-11-24 NOTE — Telephone Encounter (Signed)
Faxed /called referral to [facility] in [place], confirmation received 11/24/21. The patient has been notified.

## 2021-12-08 ENCOUNTER — Other Ambulatory Visit: Payer: Self-pay | Admitting: Family Medicine

## 2021-12-20 ENCOUNTER — Other Ambulatory Visit: Payer: Self-pay | Admitting: *Deleted

## 2021-12-20 MED ORDER — DUPIXENT 300 MG/2ML ~~LOC~~ SOSY: PREFILLED_SYRINGE | SUBCUTANEOUS | 11 refills | Status: DC

## 2021-12-23 ENCOUNTER — Ambulatory Visit (INDEPENDENT_AMBULATORY_CARE_PROVIDER_SITE_OTHER): Payer: BC Managed Care – PPO

## 2021-12-23 ENCOUNTER — Ambulatory Visit (INDEPENDENT_AMBULATORY_CARE_PROVIDER_SITE_OTHER): Payer: BC Managed Care – PPO | Admitting: Podiatry

## 2021-12-23 DIAGNOSIS — M21612 Bunion of left foot: Secondary | ICD-10-CM

## 2021-12-23 DIAGNOSIS — Z9889 Other specified postprocedural states: Secondary | ICD-10-CM | POA: Diagnosis not present

## 2021-12-25 NOTE — Progress Notes (Signed)
Subjective: Jessica Clark is a 56 y.o. is seen today in office s/p left foot first MPJ arthrodesis preformed on 08/18/2021.  States that she is doing well.  She is not having any significant pain she is able to do her activities without any restrictions.  No significant swelling.  She has no other concerns today.  No fevers or chills.   Objective: General: No acute distress, AAOx3 -presents wearing cam boot DP/PT pulses palpable 2/4, CRT < 3 sec to all digits.  Protective sensation intact. Motor function intact.  Left foot: Incision is well coapted without any evidence of dehiscence.  Toes in rectus position.  No significant discomfort on exam.  There is no tenderness to palpation.  No edema, erythema. No open lesions. No pain with calf compression, swelling, warmth, erythema.   Assessment and Plan:  Status post left first MPJ arthrodesis, fourth toe pain-resolved  -Treatment options discussed including all alternatives, risks, and complications -X-rays were obtained reviewed of the left foot.  3 views were obtained.  No subacute fracture.  Increased consolidation noted across the arthrodesis site.  Hardware intact with any complicating factors. -At this time she is wearing regular shoes as tolerated.  Discussed continuing to increase activity as tolerated and continue with supportive shoe gear. -This point with discharge from the postoperative care encouraged to call any questions or any changes.  She agrees.  Vivi Barrack DPM

## 2021-12-30 ENCOUNTER — Ambulatory Visit: Payer: BC Managed Care – PPO | Admitting: Allergy

## 2022-01-06 ENCOUNTER — Ambulatory Visit: Payer: BC Managed Care – PPO | Admitting: Allergy & Immunology

## 2022-01-06 VITALS — BP 130/72 | HR 75 | Temp 98.3°F | Resp 16 | Ht 64.0 in | Wt 155.0 lb

## 2022-01-06 DIAGNOSIS — J4540 Moderate persistent asthma, uncomplicated: Secondary | ICD-10-CM

## 2022-01-06 DIAGNOSIS — J3089 Other allergic rhinitis: Secondary | ICD-10-CM | POA: Diagnosis not present

## 2022-01-06 DIAGNOSIS — J339 Nasal polyp, unspecified: Secondary | ICD-10-CM

## 2022-01-06 MED ORDER — FLUTICASONE FUROATE-VILANTEROL 100-25 MCG/ACT IN AEPB: INHALATION_SPRAY | RESPIRATORY_TRACT | 5 refills | Status: DC

## 2022-01-06 MED ORDER — ALBUTEROL SULFATE HFA 108 (90 BASE) MCG/ACT IN AERS: INHALATION_SPRAY | RESPIRATORY_TRACT | 1 refills | Status: DC

## 2022-01-06 NOTE — Patient Instructions (Addendum)
1. Moderate persistent asthma, uncomplicated  - Lung testing looks good today. - Let's make your Breo as needed. - Daily controller medication(s): Singulair 10mg  - Prior to physical activity: albuterol 2 puffs 10-15 minutes before physical activity. - Rescue medications: albuterol 4 puffs every 4-6 hours as needed - Changes during respiratory infections or worsening symptoms: Add on Breo one puff once daily for 1-2 weeks. - Asthma control goals:  * Full participation in all desired activities (may need albuterol before activity) * Albuterol use two time or less a week on average (not counting use with activity) * Cough interfering with sleep two time or less a month * Oral steroids no more than once a year * No hospitalizations  2. Perennial and seasonal allergic rhinitis - Continue with Singulair 10mg  daily. - Continue with an over the counter antihistamine as needed.   3. Nasal polyposis - Continue with Dupixent every four weeks.  4. Return in about 1 year (around 01/07/2023).    Please inform of any Emergency Department visits, hospitalizations, or changes in symptoms. Call 03/09/2023 before going to the ED for breathing or allergy symptoms since we might be able to fit you in for a sick visit. Feel free to contact us anytime with any questions, problems, or concerns.  It was a pleasure to meet you today! Have fun in [place]!   Websites that have reliable patient information: 1. American Academy of Asthma, Allergy, and Immunology: www.aaaai.org 2. Food Allergy Research and Education (FARE): foodallergy.org 3. Mothers of Asthmatics: http://www.asthmacommunitynetwork.org 4. American College of Allergy, Asthma, and Immunology: www.acaai.org   COVID-19 Vaccine Information can be found at: [place] For questions related to vaccine distribution or appointments, please email [email or web address] or call [phone].    We realize that you might be concerned about having an allergic reaction to the COVID19 vaccines. To help with that concern, WE ARE OFFERING THE COVID19 VACCINES IN OUR OFFICE! Ask the front desk for dates!     "Like" [email or web address] on Facebook and Instagram for our latest updates!      A healthy democracy works best when [phone] participate! Make sure you are registered to vote! If you have moved or changed any of your contact information, you will need to get this updated before voting!  In some cases, you MAY be able to register to vote online: [place]

## 2022-01-06 NOTE — Progress Notes (Signed)
FOLLOW UP  Date of Service/Encounter:  01/06/22   Assessment:   Moderate persistent asthma without complication  Perennial and seasonal allergic rhinitis  Nasal polyposis - doing well on Dupixent alone  Plan/Recommendations:    1. Moderate persistent asthma, uncomplicated  - Lung testing looks good today. - Let's make your Breo as needed. - Daily controller medication(s): Singulair 10mg  - Prior to physical activity: albuterol 2 puffs 10-15 minutes before physical activity. - Rescue medications: albuterol 4 puffs every 4-6 hours as needed - Changes during respiratory infections or worsening symptoms: Add on Breo one puff once daily for 1-2 weeks. - Asthma control goals:  * Full participation in all desired activities (may need albuterol before activity) * Albuterol use two time or less a week on average (not counting use with activity) * Cough interfering with sleep two time or less a month * Oral steroids no more than once a year * No hospitalizations  2. Perennial and seasonal allergic rhinitis - Continue with Singulair 10mg  daily. - Continue with an over the counter antihistamine as needed.   3. Nasal polyposis - Continue with Dupixent every four weeks.  4. Return in about 1 year (around 01/07/2023).   Subjective:   Jessica Clark is a 56 y.o. 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  [name] is a 56 y.o. female presenting for a follow up visit.  She was last seen in May 2022.  At that time, she was continued on  Singulair as well as Breo 100 mcg 1 puff once daily and albuterol as needed.  For her allergic rhinitis, we continued with Xhance and nasal saline rinses as needed.  For her nasal polyps, she was continued on Xhance and Dupixent.  Since last visit, she has done very well. She is going to 58. She is leaving July 7th.  She is very excited about this.  Asthma/Respiratory Symptom History: She remains on her Breo. She uses her albuterol very rarely. She does use it around Christmas but otherwise not at all.  She has not been on prednisone in a number of years.  She cannot remember the last time she needed it.  She has not been nighttime coughing.  She sleeps very well at night.  She does not cough or wheeze during the day at all.  Allergic Rhinitis Symptom History: Her nasal congestion is actually under good control.  She actually has not been using Xhance at all.  She is able to breathe very well through both nostrils.  Before starting Dupixent, she had been on a number of nasal sprays and had 3 different nasal polyp surgeries.  1 of these was in 04-24-2000 and 2 of them were here in the 02-29-1972. She has been using Singulair daily.  She works as an [place] at [employer].  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  She has lived here for just over 20 years.  Her husband's job brought them over.  He is [employer] as well.  Otherwise, there have been no changes to her past medical history, surgical history, family history, or social history.    Review of Systems  Constitutional: Negative.  Negative for fever, malaise/fatigue and weight loss.  HENT: Negative.  Negative for congestion, ear discharge and ear pain.   Eyes:  Negative for pain, discharge and redness.  Respiratory:  Negative for cough, sputum production, shortness of breath and wheezing.   Cardiovascular: Negative.  Negative for chest pain and palpitations.  Gastrointestinal:  Negative for abdominal  pain, heartburn, nausea and vomiting.  Skin: Negative.  Negative for itching and rash.  Neurological:  Negative for dizziness and headaches.  Endo/Heme/Allergies:  Negative for environmental allergies. Does not bruise/bleed easily.      Objective:   Blood pressure 130/72, pulse 75, temperature 98.3 F (36.8 C), temperature source Temporal, resp. rate 16, height 5\' 4"  (1.626 m), weight 155 lb (70.3 kg), SpO2 98 %. Body mass index is 26.61 kg/m.    Physical Exam Vitals reviewed.  Constitutional:      Appearance: She is well-developed.     Comments: Very pleasant.   HENT:     Head: Normocephalic and atraumatic.     Right Ear: Tympanic membrane, ear canal and external ear normal.     Left Ear: Tympanic membrane, ear canal and external ear normal.     Nose: No nasal deformity, septal deviation, mucosal edema or rhinorrhea.     Right Turbinates: Enlarged. Not swollen or pale.     Left Turbinates: Enlarged. Not swollen or pale.     Right Sinus: No maxillary sinus tenderness or frontal sinus tenderness.     Left Sinus: No maxillary sinus tenderness or frontal sinus tenderness.     Comments: Polypoid sinus degeneration appreciated, but no over polyps.    Mouth/Throat:     Mouth: Mucous membranes are not pale and not dry.     Pharynx: Uvula midline.  Eyes:     General: Lids are normal. No allergic shiner.       Right eye: No discharge.        Left eye: No discharge.     Conjunctiva/sclera: Conjunctivae normal.     Right eye: Right conjunctiva is not injected. No chemosis.    Left eye: Left conjunctiva is not injected. No chemosis.    Pupils: Pupils are equal, round, and reactive to light.  Cardiovascular:     Rate and Rhythm: Normal rate and regular rhythm.     Heart sounds: Normal heart sounds.  Pulmonary:     Effort: Pulmonary effort is normal. No tachypnea, accessory muscle usage or respiratory distress.     Breath sounds: Normal breath sounds. No wheezing, rhonchi or rales.      Comments: Moving air well in all lung fields.  No increased work of breathing. Chest:     Chest wall: No tenderness.  Lymphadenopathy:     Cervical: No cervical adenopathy.  Skin:    Coloration: Skin is not pale.     Findings: No abrasion, erythema, petechiae or rash. Rash is not papular, urticarial or vesicular.  Neurological:     Mental Status: She is alert.  Psychiatric:        Behavior: Behavior is cooperative.     Diagnostic studies:    Spirometry: results normal (FEV1: 2.12/82%, FVC: 2.55/78%, FEV1/FVC: 83%).    Spirometry consistent with normal pattern.    Allergy Studies: none        05-03-1977, MD  [facility]

## 2022-01-08 ENCOUNTER — Encounter: Payer: Self-pay | Admitting: Allergy & Immunology

## 2022-04-25 ENCOUNTER — Telehealth: Payer: Self-pay | Admitting: Allergy

## 2022-04-25 DIAGNOSIS — J4540 Moderate persistent asthma, uncomplicated: Secondary | ICD-10-CM

## 2022-04-25 DIAGNOSIS — J3089 Other allergic rhinitis: Secondary | ICD-10-CM

## 2022-04-25 MED ORDER — MONTELUKAST SODIUM 10 MG PO TABS: 10.0000 mg | ORAL_TABLET | Freq: Every day | ORAL | 1 refills | Status: DC

## 2022-04-25 NOTE — Telephone Encounter (Signed)
Meri needs 90 days supply of Montelukast sent to [facility] please.

## 2022-04-25 NOTE — Telephone Encounter (Signed)
Sent in refill to the [facility] mail [facility] as asked.

## 2022-09-11 ENCOUNTER — Other Ambulatory Visit: Payer: Self-pay | Admitting: Allergy & Immunology

## 2022-12-14 ENCOUNTER — Other Ambulatory Visit: Payer: Self-pay | Admitting: Allergy

## 2022-12-15 ENCOUNTER — Telehealth: Payer: Self-pay | Admitting: *Deleted

## 2022-12-15 NOTE — Telephone Encounter (Signed)
L/m for patient to contact clinic for MD appt for Dupixent reapproval 

## 2023-01-18 ENCOUNTER — Ambulatory Visit: Payer: BC Managed Care – PPO | Admitting: Allergy

## 2023-01-27 ENCOUNTER — Encounter: Payer: Self-pay | Admitting: Allergy

## 2023-01-27 ENCOUNTER — Other Ambulatory Visit: Payer: Self-pay

## 2023-01-27 ENCOUNTER — Ambulatory Visit: Payer: BC Managed Care – PPO | Admitting: Allergy

## 2023-01-27 VITALS — BP 122/70 | HR 67 | Temp 98.2°F | Resp 12 | Wt 151.6 lb

## 2023-01-27 DIAGNOSIS — J4540 Moderate persistent asthma, uncomplicated: Secondary | ICD-10-CM

## 2023-01-27 DIAGNOSIS — J3089 Other allergic rhinitis: Secondary | ICD-10-CM | POA: Diagnosis not present

## 2023-01-27 DIAGNOSIS — J339 Nasal polyp, unspecified: Secondary | ICD-10-CM | POA: Diagnosis not present

## 2023-01-27 MED ORDER — AIRSUPRA 90-80 MCG/ACT IN AERO: 2.0000 | INHALATION_SPRAY | Freq: Four times a day (QID) | RESPIRATORY_TRACT | 3 refills | Status: DC | PRN

## 2023-01-27 MED ORDER — MONTELUKAST SODIUM 10 MG PO TABS: 10.0000 mg | ORAL_TABLET | Freq: Every day | ORAL | 1 refills | Status: DC

## 2023-01-27 NOTE — Progress Notes (Signed)
Follow-up Note  RE: Jessica Clark MRN: [id number] DOB: [birth year] Date of Office Visit: 01/27/2023   History of present illness: Jessica Clark is a 57 y.o. female presenting today for follow-up of nasal polyps, asthma and allergic rhinitis.  She was last seen in the office on 01/14/2022 by Dr. Dellis Anes.  She has done well over the past year without any major health changes, surgeries or hospitalizations. She states the Dupixent continues to control her nasal polyps very well.  She has a good sense of smell and taste and does not report congestion at this time.  She is doing Dupixent every 4 weeks and she does alternate her sides.  Every 4-week intervals seems to be her sweet spot in regards to controlling her polyps.  She had more symptoms at 6 and 8 weeks thus we backed back down to 4 weeks that she has done well at this interval.  She is not having any issues with self administration. She also continues to take Singulair daily.  This controls her allergy symptoms very well.  She has not needed to take any other medications for allergy control. She has not required any albuterol use in the past year.  She has not needed to use the [facility] that she has.  She has not had any ED or urgent care visits or systemic steroid needs in the past year.  Review of systems: Review of Systems  Constitutional: Negative.   HENT: Negative.    Eyes: Negative.   Respiratory: Negative.    Cardiovascular: Negative.   Gastrointestinal: Negative.   Musculoskeletal: Negative.   Skin: Negative.   Allergic/Immunologic: Negative.   Neurological: Negative.      All other systems negative unless noted above in HPI  Past medical/social/surgical/family history have been reviewed and are unchanged unless specifically indicated below.  No changes  Medication List: Current Outpatient Medications  Medication Sig Dispense Refill   albuterol (VENTOLIN HFA) 108 (90 Base) MCG/ACT inhaler Inhale 2 puffs into the  lungs every 6 (six) hours as needed for wheezing or shortness of breath.     Albuterol-Budesonide (AIRSUPRA) 90-80 MCG/ACT AERO Inhale 2 puffs into the lungs 4 (four) times daily as needed. 10.7 g 3   DIVIGEL 1 MG/GM GEL      dupilumab (DUPIXENT) 300 MG/2ML prefilled syringe INJECT 1 SYRINGE UNDER THE SKIN EVERY 14 DAYS 4 mL 11   fluticasone furoate-vilanterol (BREO ELLIPTA) 100-25 MCG/ACT AEPB USE 1 INHALATION ORALLY    DAILY 60 each 5   golimumab (SIMPONI ARIA) 50 MG/4ML SOLN injection See admin instructions.     progesterone (PROMETRIUM) 100 MG capsule Take 200 mg by mouth daily.      promethazine (PHENERGAN) 25 MG tablet Take 1 tablet (25 mg total) by mouth every 8 (eight) hours as needed for nausea or vomiting. 20 tablet 0   sulfamethoxazole-trimethoprim (BACTRIM DS) 800-160 MG tablet Take 1 tablet by mouth 2 (two) times daily. 14 tablet 0   montelukast (SINGULAIR) 10 MG tablet Take 1 tablet (10 mg total) by mouth daily. 90 tablet 1   Current Facility-Administered Medications  Medication Dose Route Frequency Provider Last Rate Last Admin   Mepolizumab SOLR 100 mL  100 mL Subcutaneous Q28 days Jessica Priest, MD   100 mL at 01/30/17 1002     Known medication allergies: Allergies  Allergen Reactions   Penicillins Other (See Comments) and Rash    Blisters in mouth     Physical examination: Blood pressure 122/70, pulse 67,  temperature 98.2 F (36.8 C), temperature source Temporal, resp. rate 12, weight 151 lb 9.6 oz (68.8 kg), SpO2 99 %.  General: Alert, interactive, in no acute distress. HEENT: PERRLA, TMs pearly gray, turbinates non-edematous without discharge, post-pharynx non erythematous. Neck: Supple without lymphadenopathy. Lungs: Clear to auscultation without wheezing, rhonchi or rales. {no increased work of breathing. CV: Normal S1, S2 without murmurs. Abdomen: Nondistended, nontender. Skin: Warm and dry, without lesions or rashes. Extremities:  No clubbing, cyanosis or  edema. Neuro:   Grossly intact.  Diagnositics/Labs: None today  Assessment and plan: Moderate persistent asthma  - Under good control! - Lung testing looks good today! - Daily controller medication(s): Singulair 10mg  - Rescue medications: AirSupra 2 puffs every 4-6 hours as needed for cough/wheeze/shortness of breath/chest tightness.   AirSupra replaces plain albuterol.  AirSupra is albuterol + budesonide for rescue purposes - Asthma control goals:  * Full participation in all desired activities (may need albuterol before activity) * Albuterol use two time or less a week on average (not counting use with activity) * Cough interfering with sleep two time or less a month * Oral steroids no more than once a year * No hospitalizations  Perennial and seasonal allergic rhinitis - Continue with Singulair 10mg  daily. - Continue with an over the counter antihistamine as needed.   Nasal polyposis - under good contol with Dupixent - Continue with Dupixent every four weeks.  Rotate sites.   Return in 12 months or sooner if needed  I appreciate the opportunity to take part in Jessica Clark's care. Please do not hesitate to contact me with questions.  Sincerely,   Margo Aye, MD Allergy/Immunology [facility]

## 2023-01-27 NOTE — Patient Instructions (Signed)
Moderate persistent asthma  - Under good control! - Lung testing looks good today! - Daily controller medication(s): Singulair 10mg  - Rescue medications: AirSupra 2 puffs every 4-6 hours as needed for cough/wheeze/shortness of breath/chest tightness.   AirSupra replaces plain albuterol.  AirSupra is albuterol + budesonide for rescue purposes - Asthma control goals:  * Full participation in all desired activities (may need albuterol before activity) * Albuterol use two time or less a week on average (not counting use with activity) * Cough interfering with sleep two time or less a month * Oral steroids no more than once a year * No hospitalizations  Perennial and seasonal allergic rhinitis - Continue with Singulair 10mg  daily. - Continue with an over the counter antihistamine as needed.   Nasal polyposis - under good contol with Dupixent - Continue with Dupixent every four weeks.  Rotate sites.   Return in 12 months or sooner if needed

## 2023-03-04 IMAGING — CR DG CHEST 2V
2 series · 2 of 2 positions shown · non-contrast
Comparison: 08/18/2011

CLINICAL DATA: Cough.  Recent COVID

EXAM:
CHEST - 2 VIEW

[w chest pa]
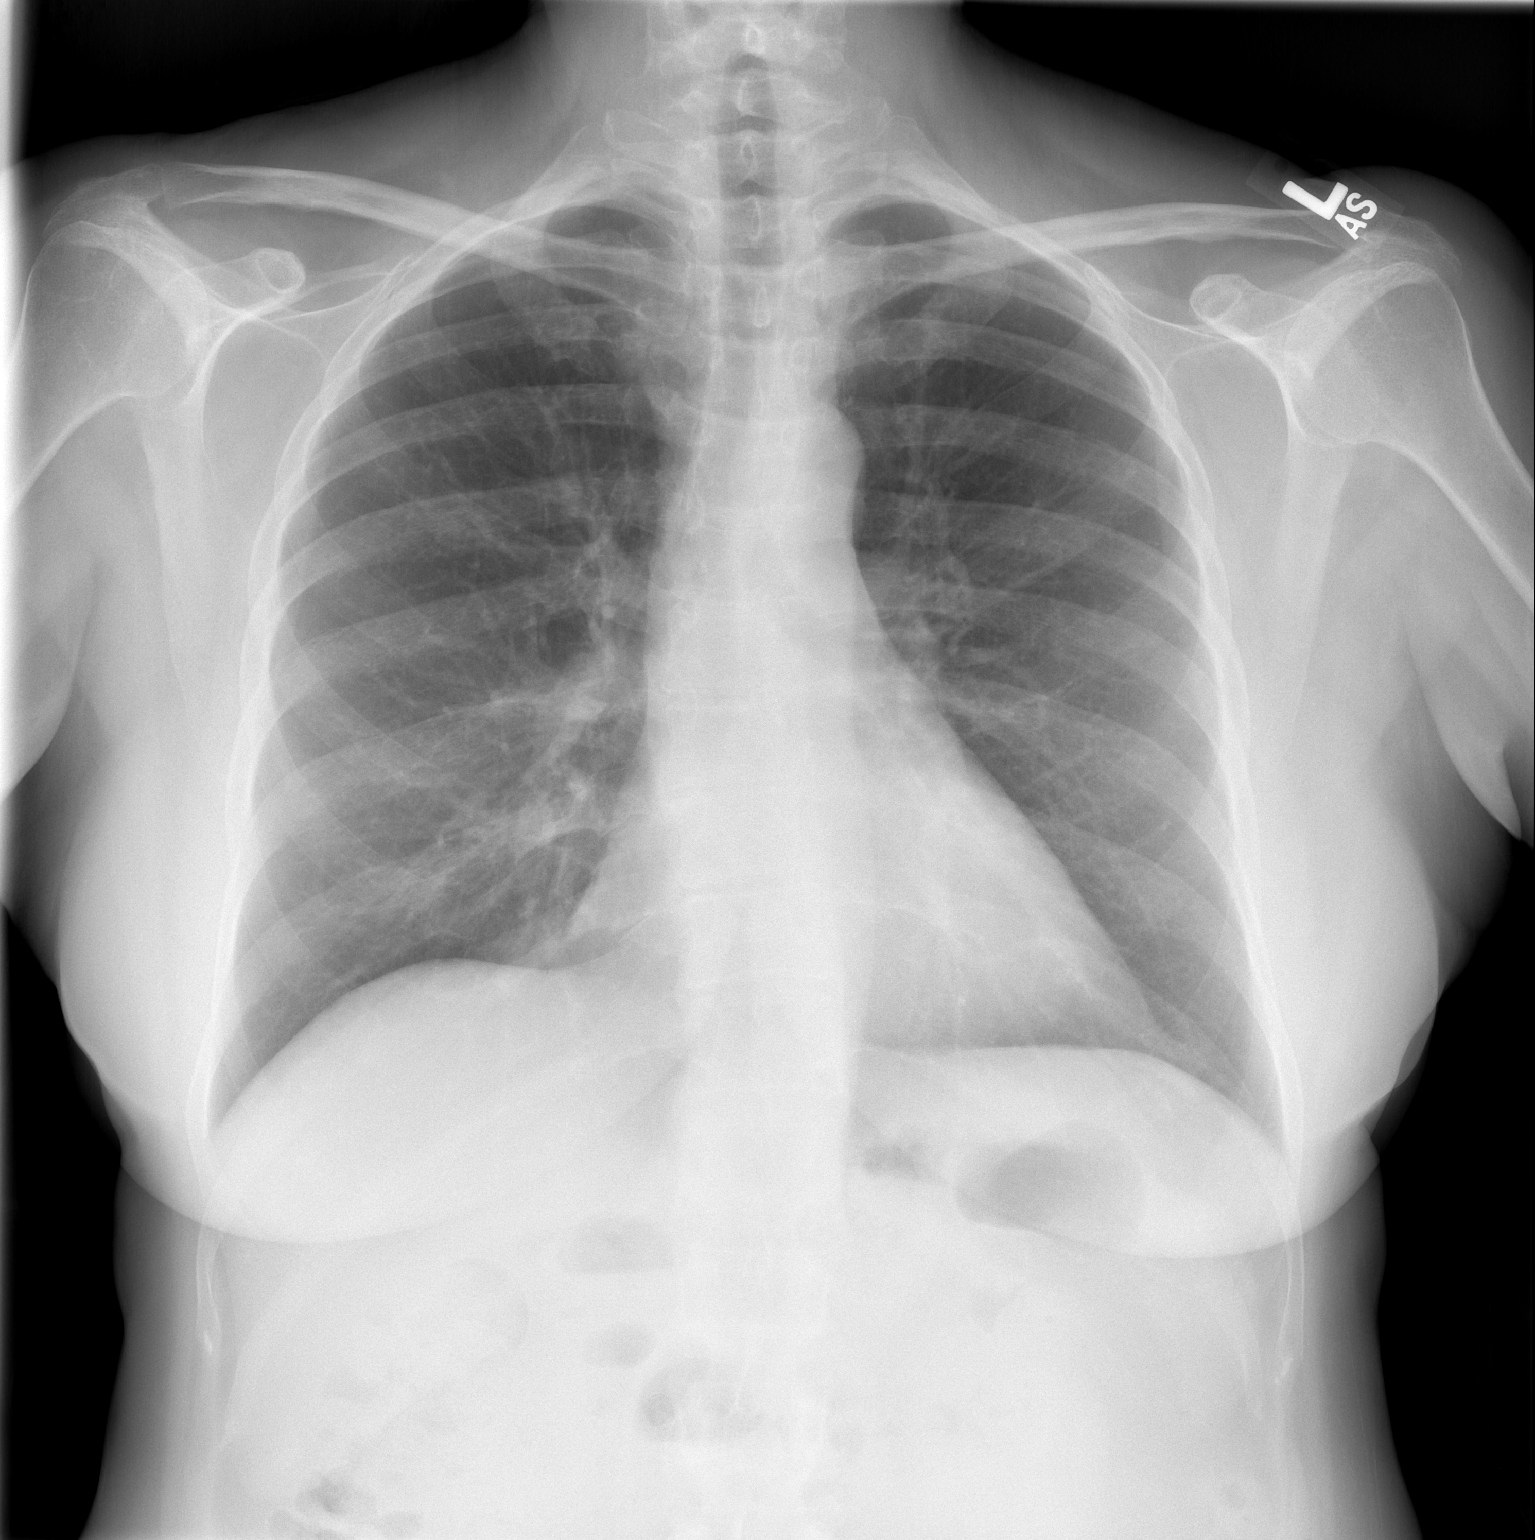

[w chest lat]
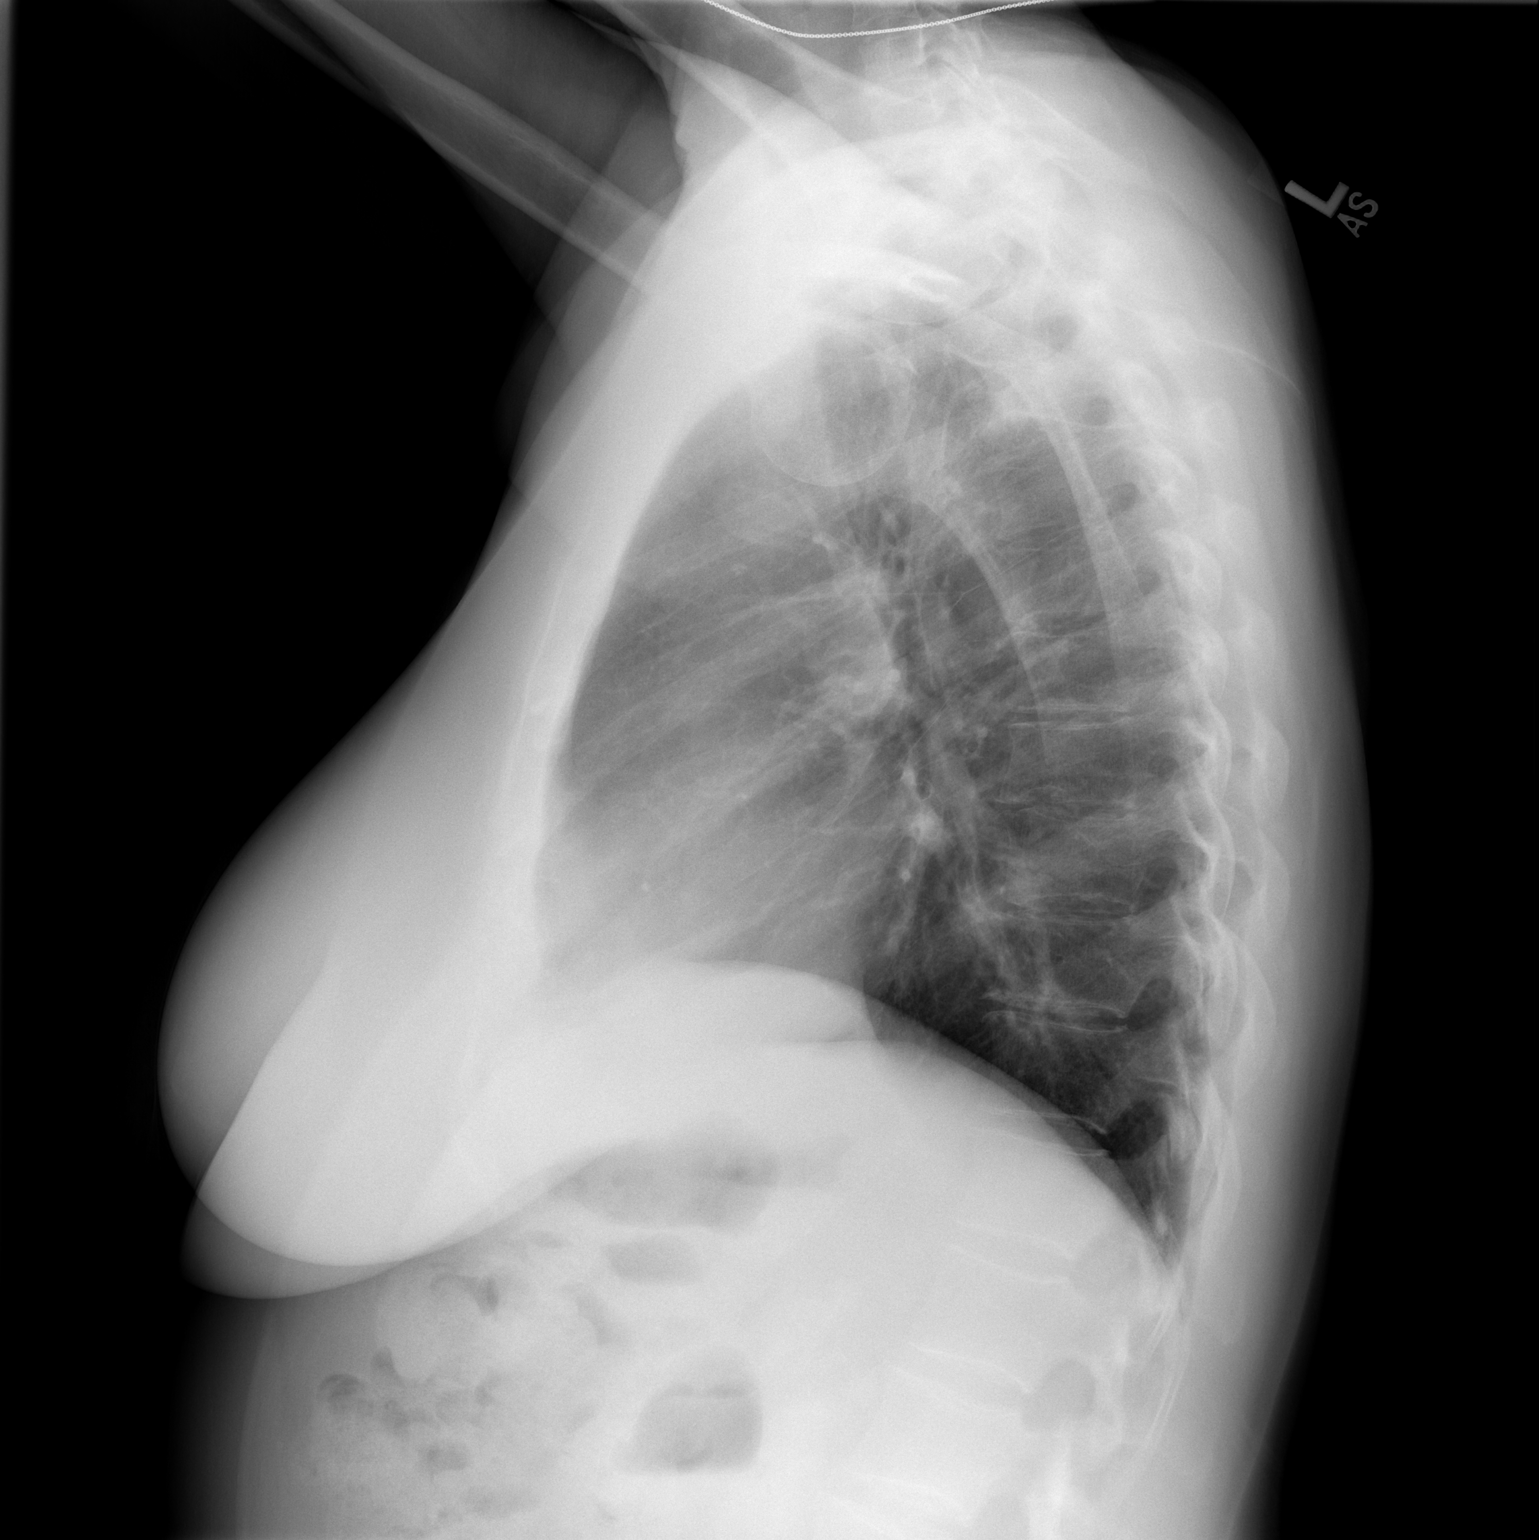

[2 of 2 positions shown; findings below may reference images not displayed]

FINDINGS: The heart size and mediastinal contours are within normal limits.
Both lungs are clear. The visualized skeletal structures are
unremarkable.
IMPRESSION: No active cardiopulmonary disease.

## 2023-03-22 ENCOUNTER — Telehealth: Payer: Self-pay | Admitting: Allergy

## 2023-03-22 NOTE — Telephone Encounter (Signed)
Patient is switching insurance to [place] and would like someone to give a call back regarding what to do. Please advise. [phone].

## 2023-03-23 NOTE — Telephone Encounter (Signed)
I called and spoke with the patient and she states that she is only on her husband's [employer] shield now and not both plans anymore.

## 2023-04-14 ENCOUNTER — Other Ambulatory Visit: Payer: Self-pay | Admitting: Obstetrics and Gynecology

## 2023-04-14 DIAGNOSIS — R928 Other abnormal and inconclusive findings on diagnostic imaging of breast: Secondary | ICD-10-CM

## 2023-04-18 ENCOUNTER — Telehealth: Payer: Self-pay | Admitting: *Deleted

## 2023-04-18 ENCOUNTER — Other Ambulatory Visit: Payer: Self-pay | Admitting: *Deleted

## 2023-04-18 ENCOUNTER — Telehealth: Payer: Self-pay | Admitting: Allergy

## 2023-04-18 MED ORDER — MONTELUKAST SODIUM 10 MG PO TABS: 10.0000 mg | ORAL_TABLET | Freq: Every day | ORAL | 1 refills | Status: DC

## 2023-04-18 NOTE — Telephone Encounter (Signed)
Refills have been sent to requested pharmacy. Called patient and informed, patient verbalized understanding.

## 2023-04-18 NOTE — Telephone Encounter (Signed)
Called patient and Jessica Clark I had received her message regarding change of Ins and working on approval and Rx to [employer]

## 2023-04-18 NOTE — Telephone Encounter (Signed)
-----   Message from [facility] Kelisha Dall V sent at 04/13/2023 12:41 PM EDT ----- Needs new approval with bcbs [place] and rx to [facility]

## 2023-04-18 NOTE — Telephone Encounter (Signed)
Patient requesting refill for montelukast.   Patient had a change in insurance and [facility] [address] [place] [address]

## 2023-04-21 ENCOUNTER — Ambulatory Visit: Payer: BC Managed Care – PPO

## 2023-04-21 ENCOUNTER — Ambulatory Visit
Admission: RE | Admit: 2023-04-21 | Discharge: 2023-04-21 | Disposition: A | Discharge status: Home / Self Care | Payer: BC Managed Care – PPO | Source: Ambulatory Visit | Attending: Obstetrics and Gynecology | Admitting: Obstetrics and Gynecology

## 2023-04-21 DIAGNOSIS — R928 Other abnormal and inconclusive findings on diagnostic imaging of breast: Secondary | ICD-10-CM

## 2023-09-05 ENCOUNTER — Telehealth: Payer: Self-pay | Admitting: Allergy

## 2023-09-05 NOTE — Telephone Encounter (Signed)
Patient called stating she needs a new prescription for Dupixent as her insurance has changed.

## 2023-09-06 NOTE — Telephone Encounter (Signed)
L/m for patient she should refills last script may 2024 and approval till Sept 2025. If she still needs refills to reach out and let me know

## 2023-10-14 ENCOUNTER — Other Ambulatory Visit: Payer: Self-pay | Admitting: Allergy

## 2024-01-17 ENCOUNTER — Other Ambulatory Visit: Payer: Self-pay | Admitting: Allergy

## 2024-01-17 NOTE — Telephone Encounter (Signed)
 Jessica Clark called and stated she is running out of her prescriptions, and would like to see if they can be refilled for her. She scheduled an OV with Dr. Tempie Fee at her first available office visit in August, but her meds will run out before then.

## 2024-01-19 NOTE — Telephone Encounter (Signed)
 I called the patient to see what medications she needed refilled. I left a message for her to call the office back.

## 2024-01-22 NOTE — Telephone Encounter (Signed)
 PT states she needs montelukast  and dupixent  refilled.

## 2024-01-23 MED ORDER — MONTELUKAST SODIUM 10 MG PO TABS: 10.0000 mg | ORAL_TABLET | Freq: Every day | ORAL | 0 refills | Status: DC

## 2024-01-23 MED ORDER — DUPIXENT 300 MG/2ML ~~LOC~~ SOSY
PREFILLED_SYRINGE | SUBCUTANEOUS | 11 refills | Status: AC
Start: 2024-01-23 — End: ?

## 2024-01-23 NOTE — Addendum Note (Signed)
 Addended by: Evangelina Hilt on: 01/23/2024 11:16 AM   Modules accepted: Orders

## 2024-01-23 NOTE — Telephone Encounter (Signed)
 Spoke with patient--DOB verified--informed her that a courtesy refill of montelukast  would be sent in. Forwarding message to [facility] for dupixent  refill. Verbalized understanding.

## 2024-03-06 ENCOUNTER — Telehealth: Payer: Self-pay | Admitting: Allergy

## 2024-03-06 NOTE — Telephone Encounter (Signed)
 PT called to ask about Dupixent  refill, since [facility] had denied - reviewed chart and advised PT that we already provided courtesy sample and only way would be to get an earlier OV. She thanked and advised would call back tomorrow after reviewing calendar

## 2024-03-28 ENCOUNTER — Encounter: Payer: Self-pay | Admitting: Allergy

## 2024-03-28 ENCOUNTER — Ambulatory Visit: Admitting: Allergy

## 2024-03-28 ENCOUNTER — Other Ambulatory Visit: Payer: Self-pay

## 2024-03-28 VITALS — BP 122/86 | HR 58 | Temp 98.0°F | Resp 14 | Ht 64.25 in | Wt 146.2 lb

## 2024-03-28 DIAGNOSIS — J339 Nasal polyp, unspecified: Secondary | ICD-10-CM | POA: Diagnosis not present

## 2024-03-28 DIAGNOSIS — J3089 Other allergic rhinitis: Secondary | ICD-10-CM | POA: Diagnosis not present

## 2024-03-28 DIAGNOSIS — J4540 Moderate persistent asthma, uncomplicated: Secondary | ICD-10-CM

## 2024-03-28 MED ORDER — AIRSUPRA 90-80 MCG/ACT IN AERO: 2.0000 | INHALATION_SPRAY | RESPIRATORY_TRACT | 1 refills | Status: AC | PRN

## 2024-03-28 NOTE — Patient Instructions (Addendum)
 Moderate persistent asthma  - Remains under great control - Lung testing looks good today! - Daily controller medication(s): none.   Will try to see how she does off of Singulair  at this time.  She will monitor for symptoms and if she does have increased respiratory or allergy symptoms then will resume Singulair .  - Rescue medications: AirSupra  2 puffs every 4-6 hours as needed for cough/wheeze/shortness of breath/chest tightness.    - Asthma control goals:  * Full participation in all desired activities (may need albuterol  before activity) * Albuterol  use two time or less a week on average (not counting use with activity) * Cough interfering with sleep two time or less a month * Oral steroids no more than once a year * No hospitalizations  Perennial and seasonal allergic rhinitis - As above we will see how she does off Singulair .  Resume if any increased allergy symptoms develop - Continue with an over the counter antihistamine like Allegra, Xyzal  or Zyrtec as needed.   Nasal polyposis - remains good contol with Dupixent .  Nasal exam without evidence of polyp regrowth - Continue with Dupixent .  Rotate sites.  Advised today can try to see if can increase interval to 6 weeks and still maintain good control if not we will go back to 4-week monthly dosing.  We know that 4-week intervals provide great control for nasal polyps.  Return in 12 months or sooner if needed

## 2024-03-28 NOTE — Progress Notes (Signed)
 Follow-up Note  RE: Jessica Clark MRN: [id number] DOB: 12/01/1965 Date of Office Visit: 03/28/2024   History of present illness: Jessica Clark is a 58 y.o. female presenting today for follow-up of nasal polyps, asthma, allergic rhinitis.  She was last seen in the office on 01/27/2023 by myself. Discussed the use of AI scribe software for clinical note transcription with the patient, who gave verbal consent to proceed.  History of Present Illness   Jessica Clark is a 58 year old female with asthma and nasal polyps who presents for a follow-up on her asthma and allergy management.  She reports no recent asthma flare-ups and has not needed to use her rescue inhaler, Airsupra . She continues to use montelukast  (Singulair ) as a controller medication. She has a two-week supply of montelukast  remaining.  She has been using Dupixent  monthly for her nasal polyps and asthma. She previously attempted to extend the interval between injections to six weeks but noticed a difference and returned to the four-week schedule. She is open to trying a longer interval again in the future.  No allergy symptoms this year despite unusual pollen and mold levels. Her sense of smell and taste are intact, and she humorously notes her husband's comment about her improved sense of smell.  Current medications include Airsupra  as a rescue inhaler, montelukast , and Dupixent  injections. She manages her Dupixent  injections by allowing them to reach room temperature to minimize discomfort.  No recent asthma flare-ups, use of her rescue inhaler, or allergy symptoms. Her sense of smell and taste are intact.       Review of systems: 10pt ROS negative unless noted above in HPI  Past medical/social/surgical/family history have been reviewed and are unchanged unless specifically indicated below.  No changes  Medication List: Current Outpatient Medications  Medication Sig Dispense Refill   albuterol  (VENTOLIN  HFA)  108 (90 Base) MCG/ACT inhaler Inhale 2 puffs into the lungs every 6 (six) hours as needed for wheezing or shortness of breath.     Albuterol -Budesonide (AIRSUPRA ) 90-80 MCG/ACT AERO Inhale 2 puffs into the lungs 4 (four) times daily as needed. 10.7 g 3   DIVIGEL 1 MG/GM GEL      dupilumab  (DUPIXENT ) 300 MG/2ML prefilled syringe INJECT 1 SYRINGE UNDER THE SKIN EVERY 14 DAYS 4 mL 11   fluticasone  furoate-vilanterol (BREO ELLIPTA ) 100-25 MCG/ACT AEPB USE 1 INHALATION ORALLY    DAILY 60 each 5   golimumab (SIMPONI ARIA) 50 MG/4ML SOLN injection See admin instructions.     montelukast  (SINGULAIR ) 10 MG tablet Take 1 tablet (10 mg total) by mouth daily. 90 tablet 0   progesterone (PROMETRIUM) 100 MG capsule Take 200 mg by mouth daily.      promethazine  (PHENERGAN ) 25 MG tablet Take 1 tablet (25 mg total) by mouth every 8 (eight) hours as needed for nausea or vomiting. (Patient not taking: Reported on 03/28/2024) 20 tablet 0   sulfamethoxazole -trimethoprim  (BACTRIM  DS) 800-160 MG tablet Take 1 tablet by mouth 2 (two) times daily. (Patient not taking: Reported on 03/28/2024) 14 tablet 0   Current Facility-Administered Medications  Medication Dose Route Frequency Provider Last Rate Last Admin   Mepolizumab  SOLR 100 mL  100 mL Subcutaneous Q28 days Kozlow, Eric J, MD   100 mL at 01/30/17 1002     Known medication allergies: Allergies  Allergen Reactions   Penicillins Other (See Comments) and Rash    Blisters in mouth     Physical examination: Blood pressure 122/86, pulse (!) 58, temperature 98  F (36.7 C), temperature source Temporal, resp. rate 14, height 5' 4.25 (1.632 m), weight 146 lb 3.2 oz (66.3 kg), SpO2 97%.  General: Alert, interactive, in no acute distress. HEENT: PERRLA, TMs pearly gray, turbinates non-edematous without discharge, post-pharynx non erythematous. Neck: Supple without lymphadenopathy. Lungs: Clear to auscultation without wheezing, rhonchi or rales. {no increased work of  breathing. CV: Normal S1, S2 without murmurs. Abdomen: Nondistended, nontender. Skin: Warm and dry, without lesions or rashes. Extremities:  No clubbing, cyanosis or edema. Neuro:   Grossly intact.  Diagnostics/Labs:  Spirometry: FEV1: 2.33 L 92%, FVC: 2.97 L 93%, ratio consistent with nonobstructive pattern  Assessment and plan:   Moderate persistent asthma  - Remains under great control - Lung testing looks good today! - Daily controller medication(s): none.   Will try to see how she does off of Singulair  at this time.  She will monitor for symptoms and if she does have increased respiratory or allergy symptoms then will resume Singulair .  - Rescue medications: AirSupra  2 puffs every 4-6 hours as needed for cough/wheeze/shortness of breath/chest tightness.    - Asthma control goals:  * Full participation in all desired activities (may need albuterol  before activity) * Albuterol  use two time or less a week on average (not counting use with activity) * Cough interfering with sleep two time or less a month * Oral steroids no more than once a year * No hospitalizations  Perennial and seasonal allergic rhinitis - As above we will see how she does off Singulair .  Resume if any increased allergy symptoms develop - Continue with an over the counter antihistamine like Allegra, Xyzal  or Zyrtec as needed.   Nasal polyposis - remains good contol with Dupixent .  Nasal exam without evidence of polyp regrowth - Continue with Dupixent .  Rotate sites.  Advised today can try to see if can increase interval to 6 weeks and still maintain good control if not we will go back to 4-week monthly dosing.  We know that 4-week intervals provide great control for nasal polyps.  Return in 12 months or sooner if needed  I appreciate the opportunity to take part in Jessica Clark's care. Please do not hesitate to contact me with questions.  Sincerely,   Danita Brain, MD Allergy/Immunology [facility]

## 2024-03-29 NOTE — Addendum Note (Signed)
 Addended by: DANIEL NIVIA DEL on: 03/29/2024 04:35 PM   Modules accepted: Orders

## 2024-04-21 ENCOUNTER — Other Ambulatory Visit: Payer: Self-pay | Admitting: Family Medicine

## 2025-03-28 ENCOUNTER — Ambulatory Visit: Admitting: Allergy
